# Patient Record
Sex: Female | Born: 1940 | Race: White | Hispanic: No | Marital: Married
Health system: Midwestern US, Academic
[De-identification: ages and names within clinical notes are randomized; demographics above are authoritative.]

---

## 2017-06-04 ENCOUNTER — Encounter: Admit: 2017-06-04 | Discharge: 2017-06-05 | Payer: MEDICARE

## 2017-06-04 DIAGNOSIS — R69 Illness, unspecified: Principal | ICD-10-CM

## 2017-07-07 ENCOUNTER — Encounter: Admit: 2017-07-07 | Discharge: 2017-07-07 | Payer: MEDICARE

## 2017-07-09 ENCOUNTER — Encounter: Admit: 2017-07-09 | Discharge: 2017-07-09 | Payer: MEDICARE

## 2017-07-09 DIAGNOSIS — R69 Illness, unspecified: Principal | ICD-10-CM

## 2017-07-09 DIAGNOSIS — C801 Malignant (primary) neoplasm, unspecified: Principal | ICD-10-CM

## 2017-07-09 DIAGNOSIS — I10 Essential (primary) hypertension: ICD-10-CM

## 2017-07-09 DIAGNOSIS — F419 Anxiety disorder, unspecified: ICD-10-CM

## 2017-07-09 DIAGNOSIS — D329 Benign neoplasm of meninges, unspecified: ICD-10-CM

## 2017-07-09 DIAGNOSIS — F329 Major depressive disorder, single episode, unspecified: ICD-10-CM

## 2017-07-13 ENCOUNTER — Encounter: Admit: 2017-07-13 | Discharge: 2017-07-13 | Payer: MEDICARE

## 2017-07-13 ENCOUNTER — Ambulatory Visit: Admit: 2017-07-13 | Discharge: 2017-07-14 | Payer: MEDICARE

## 2017-07-13 DIAGNOSIS — D329 Benign neoplasm of meninges, unspecified: Principal | ICD-10-CM

## 2017-07-13 DIAGNOSIS — F329 Major depressive disorder, single episode, unspecified: ICD-10-CM

## 2017-07-13 DIAGNOSIS — I10 Essential (primary) hypertension: ICD-10-CM

## 2017-07-13 DIAGNOSIS — C801 Malignant (primary) neoplasm, unspecified: Principal | ICD-10-CM

## 2017-07-13 DIAGNOSIS — F419 Anxiety disorder, unspecified: ICD-10-CM

## 2017-07-13 NOTE — Progress Notes
Date of Service: 07/13/2017    Subjective:             Angela Arias is a 76 y.o. female.    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  She was diagnosed with a left frontal parasagittal meningioma in 2013.   She had surgery at that time and was told that a small residual tumor was left on the superior sagittal sinus.  Pathology showed an atypical meningioma. Patient did not have radiation therapy after that.  She developed an episode of seizure postop.  She was then followed with serial imaging. In 2015, she was diagnosed with recurrence/progression.  The mass continued to progress over time. At this point, her current MRI shows the presence of a 4 cm tumor.  Clinically she is doing well. No weakness, or numbness. No significant headache. No speech difficulty.           Review of Systems   Constitutional: Positive for fatigue.   HENT: Positive for tinnitus.    Musculoskeletal: Positive for back pain.   Psychiatric/Behavioral: Positive for dysphoric mood. The patient is nervous/anxious.          Objective:         ??? amLODIPine (NORVASC) 5 mg tablet Take 5 mg by mouth daily.   ??? ascorbic acid(+) (VITAMIN C) 1,000 mg tablet Take 1 tablet by mouth daily.   ??? aspirin 81 mg chewable tablet Chew 81 mg by mouth daily. Take with food.   ??? Calcium-Vitamin D3-Vitamin K (VIACTIV) 500-500-40 mg-unit-mcg chew Chew 1 tablet by mouth daily.   ??? Fish Oil-Omega-3 Fatty Acids (FISH OIL) 360-1,200 mg cap Take 1 capsule by mouth daily.   ??? levothyroxine (SYNTHROID) 75 mcg tablet Take 75 mcg by mouth daily 30 minutes before breakfast.   ??? MULTIVITAMIN PO Take 1 tablet by mouth daily.   ??? PARoxetine (PAXIL) 20 mg tablet Take 20 mg by mouth daily.   ??? simvastatin (ZOCOR) 20 mg tablet Take 20 mg by mouth at bedtime daily.   ??? vitamin E 400 unit capsule Take 400 Units by mouth daily.         Physical Exam Constitutional: She is oriented to person, place, and time. She appears well-developed and well-nourished.   HENT:   Head: Normocephalic and atraumatic.   Eyes: Conjunctivae and EOM are normal. Pupils are equal, round, and reactive to light.   Neck: Neck supple.   Cardiovascular: Normal rate.    Pulmonary/Chest: Effort normal. No respiratory distress.   Abdominal: Soft. She exhibits no distension.   Neurological: She is alert and oriented to person, place, and time. She has normal strength. No cranial nerve deficit.   Limited movement of right shoulder because of previous dislocation and surgeries.   Skin: Skin is warm and dry.   Psychiatric: She has a normal mood and affect.            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  I discussed the condition with her and her family and answered their questions.  I recommend surgery for resection. I also recommend radiation therapy postop to minimize the risk of recurrence.  We talked in details about the risks of surgery.  She verbalized understanding and agreed with the plan.

## 2017-07-15 ENCOUNTER — Encounter: Admit: 2017-07-15 | Discharge: 2017-07-15 | Payer: MEDICARE

## 2017-07-15 DIAGNOSIS — D689 Coagulation defect, unspecified: ICD-10-CM

## 2017-07-15 DIAGNOSIS — D329 Benign neoplasm of meninges, unspecified: Principal | ICD-10-CM

## 2017-08-03 ENCOUNTER — Ambulatory Visit: Admit: 2017-08-03 | Discharge: 2017-08-03 | Payer: MEDICARE

## 2017-08-03 ENCOUNTER — Inpatient Hospital Stay: Admit: 2017-08-03 | Discharge: 2017-08-03 | Payer: MEDICARE

## 2017-08-03 ENCOUNTER — Encounter: Admit: 2017-08-03 | Discharge: 2017-08-03 | Payer: MEDICARE

## 2017-08-03 DIAGNOSIS — I6781 Acute cerebrovascular insufficiency: ICD-10-CM

## 2017-08-03 DIAGNOSIS — R011 Cardiac murmur, unspecified: ICD-10-CM

## 2017-08-03 DIAGNOSIS — F329 Major depressive disorder, single episode, unspecified: ICD-10-CM

## 2017-08-03 DIAGNOSIS — D689 Coagulation defect, unspecified: ICD-10-CM

## 2017-08-03 DIAGNOSIS — Z01810 Encounter for preprocedural cardiovascular examination: Secondary | ICD-10-CM

## 2017-08-03 DIAGNOSIS — D329 Benign neoplasm of meninges, unspecified: Principal | ICD-10-CM

## 2017-08-03 DIAGNOSIS — M1990 Unspecified osteoarthritis, unspecified site: ICD-10-CM

## 2017-08-03 DIAGNOSIS — R112 Nausea with vomiting, unspecified: ICD-10-CM

## 2017-08-03 DIAGNOSIS — I6529 Occlusion and stenosis of unspecified carotid artery: ICD-10-CM

## 2017-08-03 DIAGNOSIS — I10 Essential (primary) hypertension: ICD-10-CM

## 2017-08-03 DIAGNOSIS — F419 Anxiety disorder, unspecified: ICD-10-CM

## 2017-08-03 DIAGNOSIS — K5790 Diverticulosis of intestine, part unspecified, without perforation or abscess without bleeding: ICD-10-CM

## 2017-08-03 DIAGNOSIS — K219 Gastro-esophageal reflux disease without esophagitis: ICD-10-CM

## 2017-08-03 DIAGNOSIS — C801 Malignant (primary) neoplasm, unspecified: Principal | ICD-10-CM

## 2017-08-03 DIAGNOSIS — E785 Hyperlipidemia, unspecified: ICD-10-CM

## 2017-08-03 DIAGNOSIS — R569 Unspecified convulsions: ICD-10-CM

## 2017-08-03 DIAGNOSIS — E039 Hypothyroidism, unspecified: ICD-10-CM

## 2017-08-03 LAB — PTT (APTT): Lab: 31 s (ref 20.0–36.0)

## 2017-08-03 LAB — POC CREATININE, RAD: Lab: 0.7 mg/dL (ref 0.4–1.00)

## 2017-08-03 LAB — PROTIME INR (PT): Lab: 0.9 (ref 0.8–1.2)

## 2017-08-03 MED ORDER — GADOBENATE DIMEGLUMINE 529 MG/ML (0.1MMOL/0.2ML) IV SOLN
12 mL | Freq: Once | INTRAVENOUS | 0 refills | Status: CP
Start: 2017-08-03 — End: ?
  Administered 2017-08-03: 19:00:00 12 mL via INTRAVENOUS

## 2017-08-12 ENCOUNTER — Inpatient Hospital Stay: Admit: 2017-08-12 | Discharge: 2017-08-12 | Payer: MEDICARE

## 2017-08-12 ENCOUNTER — Encounter: Admit: 2017-08-12 | Discharge: 2017-08-12 | Payer: MEDICARE

## 2017-08-12 ENCOUNTER — Inpatient Hospital Stay: Admit: 2017-08-12 | Discharge: 2017-08-17 | Disposition: A | Discharge status: Home Health | Payer: MEDICARE

## 2017-08-12 DIAGNOSIS — R112 Nausea with vomiting, unspecified: ICD-10-CM

## 2017-08-12 DIAGNOSIS — D329 Benign neoplasm of meninges, unspecified: ICD-10-CM

## 2017-08-12 DIAGNOSIS — F329 Major depressive disorder, single episode, unspecified: ICD-10-CM

## 2017-08-12 DIAGNOSIS — C801 Malignant (primary) neoplasm, unspecified: Principal | ICD-10-CM

## 2017-08-12 DIAGNOSIS — K5790 Diverticulosis of intestine, part unspecified, without perforation or abscess without bleeding: ICD-10-CM

## 2017-08-12 DIAGNOSIS — E785 Hyperlipidemia, unspecified: ICD-10-CM

## 2017-08-12 DIAGNOSIS — F419 Anxiety disorder, unspecified: ICD-10-CM

## 2017-08-12 DIAGNOSIS — I10 Essential (primary) hypertension: ICD-10-CM

## 2017-08-12 DIAGNOSIS — M1990 Unspecified osteoarthritis, unspecified site: ICD-10-CM

## 2017-08-12 DIAGNOSIS — E039 Hypothyroidism, unspecified: ICD-10-CM

## 2017-08-12 DIAGNOSIS — R569 Unspecified convulsions: ICD-10-CM

## 2017-08-12 DIAGNOSIS — K219 Gastro-esophageal reflux disease without esophagitis: ICD-10-CM

## 2017-08-12 DIAGNOSIS — R011 Cardiac murmur, unspecified: ICD-10-CM

## 2017-08-12 LAB — POC HEMATOCRIT
Lab: 14 g/dL (ref 12.0–15.0)
Lab: 43 % (ref 36–45)

## 2017-08-12 LAB — POC BLOOD GAS ARTERIAL
Lab: 1 MMOL/L
Lab: 138 mmHg — ABNORMAL HIGH (ref 80–100)
Lab: 2 MMOL/L
Lab: 26 MMOL/L (ref 21–28)
Lab: 26 MMOL/L (ref 21–28)
Lab: 66 mmHg — ABNORMAL HIGH (ref >60)
Lab: 7.1 — CL (ref 7.35–7.45)
Lab: 7.2 mL/min — ABNORMAL LOW (ref >60)
Lab: 71 mmHg — ABNORMAL HIGH (ref 35–45)
Lab: 98 % (ref 95–99)
Lab: 7.2 — ABNORMAL LOW (ref 7.35–7.45)
Lab: 71 mmHg — ABNORMAL HIGH (ref 35–45)
Lab: 0 MMOL/L
Lab: 124 mmHg — ABNORMAL HIGH (ref 80–100)
Lab: 27 MMOL/L (ref 21–28)
Lab: 66 mmHg — ABNORMAL HIGH (ref 35–45)
Lab: 7.2 — ABNORMAL LOW (ref 7.35–7.45)
Lab: 98 % (ref 95–99)

## 2017-08-12 LAB — BLOOD GASES, ARTERIAL
Lab: 1.8 MMOL/L
Lab: 153 mmHg — ABNORMAL HIGH (ref 80–100)
Lab: 22 MMOL/L (ref 21–28)
Lab: 44 mmHg (ref 35–45)
Lab: 7.3 — ABNORMAL LOW (ref 7.35–7.45)
Lab: 97 % (ref 95–99)

## 2017-08-12 LAB — POC IONIZED CALCIUM: Lab: 1 MMOL/L (ref 1.0–1.3)

## 2017-08-12 LAB — POC SODIUM: Lab: 143 MMOL/L — ABNORMAL LOW (ref 137–147)

## 2017-08-12 LAB — POC GLUCOSE: Lab: 148 mg/dL — ABNORMAL HIGH (ref 70–100)

## 2017-08-12 LAB — POC POTASSIUM: Lab: 3.5 MMOL/L — ABNORMAL LOW (ref 3.5–5.1)

## 2017-08-12 MED ORDER — LEVETIRACETAM IVPB
0 refills | Status: DC
Start: 2017-08-12 — End: 2017-08-12
  Administered 2017-08-12 (×2): 1000 mg via INTRAVENOUS

## 2017-08-12 MED ORDER — EPHEDRINE SULFATE 50 MG/ML IJ SOLN
0 refills | Status: DC
Start: 2017-08-12 — End: 2017-08-12
  Administered 2017-08-12 (×3): 10 mg via INTRAVENOUS

## 2017-08-12 MED ORDER — DEXMEDETOMIDINE IV DRIP (STD CONC)
0.2-1 ug/kg/h | INTRAVENOUS | 0 refills | Status: DC
Start: 2017-08-12 — End: 2017-08-13
  Administered 2017-08-12 (×2): 0.6 ug/kg/h via INTRAVENOUS

## 2017-08-12 MED ORDER — PAROXETINE HCL 20 MG PO TAB
20 mg | Freq: Every evening | ORAL | 0 refills | Status: DC
Start: 2017-08-12 — End: 2017-08-17
  Administered 2017-08-14 – 2017-08-17 (×4): 20 mg via ORAL

## 2017-08-12 MED ORDER — LIDOCAINE (PF) 200 MG/10 ML (2 %) IJ SYRG
0 refills | Status: DC
Start: 2017-08-12 — End: 2017-08-12
  Administered 2017-08-12: 13:00:00 60 mg via INTRAVENOUS

## 2017-08-12 MED ORDER — PROPOFOL 10 MG/ML IV EMUL
5-150 ug/kg/min | INTRAVENOUS | 0 refills | Status: DC
Start: 2017-08-12 — End: 2017-08-12
  Administered 2017-08-12: 21:00:00 7 ug/kg/min via INTRAVENOUS

## 2017-08-12 MED ORDER — PROPOFOL INJ 10 MG/ML IV VIAL
0 refills | Status: DC
Start: 2017-08-12 — End: 2017-08-12
  Administered 2017-08-12: 13:00:00 60 mg via INTRAVENOUS
  Administered 2017-08-12: 13:00:00 110 mg via INTRAVENOUS
  Administered 2017-08-12: 13:00:00 30 mg via INTRAVENOUS

## 2017-08-12 MED ORDER — FENTANYL CITRATE (PF) 50 MCG/ML IJ SOLN
0 refills | Status: DC
Start: 2017-08-12 — End: 2017-08-12
  Administered 2017-08-12 (×2): 50 ug via INTRAVENOUS

## 2017-08-12 MED ORDER — FAMOTIDINE 20 MG PO TAB
40 mg | Freq: Every day | ORAL | 0 refills | Status: DC
Start: 2017-08-12 — End: 2017-08-12

## 2017-08-12 MED ORDER — SUCCINYLCHOLINE CHLORIDE 20 MG/ML IJ SOLN
2 mg/kg | Freq: Once | INTRAVENOUS | 0 refills | Status: CP
Start: 2017-08-12 — End: ?

## 2017-08-12 MED ORDER — SUFENTANIL 100 MCG IN NS 10 ML (OR)
0 refills | Status: DC
Start: 2017-08-12 — End: 2017-08-12
  Administered 2017-08-12 (×2): 0.1 ug/kg/h via INTRAVENOUS

## 2017-08-12 MED ORDER — DEXAMETHASONE SODIUM PHOSPHATE 4 MG/ML IJ SOLN
4 mg | INTRAVENOUS | 0 refills | Status: DC
Start: 2017-08-12 — End: 2017-08-14
  Administered 2017-08-12 – 2017-08-14 (×7): 4 mg via INTRAVENOUS

## 2017-08-12 MED ORDER — LEVETIRACETAM IN NACL (ISO-OS) 500 MG/100 ML IV PGBK
500 mg | Freq: Two times a day (BID) | INTRAVENOUS | 0 refills | Status: DC
Start: 2017-08-12 — End: 2017-08-12

## 2017-08-12 MED ORDER — LEVOTHYROXINE 75 MCG PO TAB
75 ug | Freq: Every day | ORAL | 0 refills | Status: DC
Start: 2017-08-12 — End: 2017-08-17
  Administered 2017-08-14 – 2017-08-17 (×4): 75 ug via ORAL

## 2017-08-12 MED ORDER — SENNOSIDES-DOCUSATE SODIUM 8.6-50 MG PO TAB
1 | Freq: Two times a day (BID) | ORAL | 0 refills | Status: DC
Start: 2017-08-12 — End: 2017-08-17
  Administered 2017-08-14 – 2017-08-17 (×6): 1 via ORAL

## 2017-08-12 MED ORDER — ACETAMINOPHEN 325 MG PO TAB
650 mg | ORAL | 0 refills | Status: DC | PRN
Start: 2017-08-12 — End: 2017-08-17
  Administered 2017-08-14: 02:00:00 650 mg via ORAL

## 2017-08-12 MED ORDER — ACETAMINOPHEN 500 MG PO TAB
1000 mg | Freq: Once | ORAL | 0 refills | Status: DC
Start: 2017-08-12 — End: 2017-08-13

## 2017-08-12 MED ORDER — BISACODYL 10 MG RE SUPP
10 mg | Freq: Every day | RECTAL | 0 refills | Status: DC | PRN
Start: 2017-08-12 — End: 2017-08-17

## 2017-08-12 MED ORDER — LEVETIRACETAM 500 MG PO TAB
500 mg | Freq: Two times a day (BID) | ORAL | 0 refills | Status: DC
Start: 2017-08-12 — End: 2017-08-13

## 2017-08-12 MED ORDER — AMLODIPINE 5 MG PO TAB
5 mg | Freq: Every day | ORAL | 0 refills | Status: DC
Start: 2017-08-12 — End: 2017-08-17
  Administered 2017-08-14 – 2017-08-17 (×4): 5 mg via ORAL

## 2017-08-12 MED ORDER — ONDANSETRON HCL (PF) 4 MG/2 ML IJ SOLN
INTRAVENOUS | 0 refills | Status: DC
Start: 2017-08-12 — End: 2017-08-12
  Administered 2017-08-12: 17:00:00 4 mg via INTRAVENOUS

## 2017-08-12 MED ORDER — BACITRACIN ZINC 500 UNIT/GRAM TP OINT
0 refills | Status: DC
Start: 2017-08-12 — End: 2017-08-12
  Administered 2017-08-12: 15:00:00 1 via TOPICAL

## 2017-08-12 MED ORDER — GABAPENTIN 300 MG PO CAP
600 mg | Freq: Once | ORAL | 0 refills | Status: DC
Start: 2017-08-12 — End: 2017-08-13

## 2017-08-12 MED ORDER — DEXMEDETOMIDINE IN 0.9 % NACL 400 MCG/100 ML (4 MCG/ML) IV SOLN
.2-1 ug/kg/h | INTRAVENOUS | 0 refills | Status: DC
Start: 2017-08-12 — End: 2017-08-13
  Administered 2017-08-13: 04:00:00 0.6 ug/kg/h via INTRAVENOUS

## 2017-08-12 MED ORDER — FAMOTIDINE (PF) 20 MG/2 ML IV SOLN
20 mg | Freq: Two times a day (BID) | INTRAVENOUS | 0 refills | Status: DC
Start: 2017-08-12 — End: 2017-08-13
  Administered 2017-08-13 (×2): 20 mg via INTRAVENOUS

## 2017-08-12 MED ORDER — DOCUSATE SODIUM 100 MG PO CAP
100 mg | Freq: Two times a day (BID) | ORAL | 0 refills | Status: DC
Start: 2017-08-12 — End: 2017-08-17
  Administered 2017-08-14 – 2017-08-16 (×5): 100 mg via ORAL

## 2017-08-12 MED ORDER — SODIUM CHLORIDE 0.9 % IV SOLP
1000 mL | INTRAVENOUS | 0 refills | Status: DC
Start: 2017-08-12 — End: 2017-08-12

## 2017-08-12 MED ORDER — POTASSIUM CHLORIDE 20 MEQ PO TBTQ
40-60 meq | ORAL | 0 refills | Status: DC | PRN
Start: 2017-08-12 — End: 2017-08-14

## 2017-08-12 MED ORDER — SUGAMMADEX 100 MG/ML IV SOLN
INTRAVENOUS | 0 refills | Status: DC
Start: 2017-08-12 — End: 2017-08-12
  Administered 2017-08-12: 17:00:00 120 mg via INTRAVENOUS

## 2017-08-12 MED ORDER — BACITRACIN 50,000 UN LR 500 ML IRR BOT (OR)
0 refills | Status: DC
Start: 2017-08-12 — End: 2017-08-12
  Administered 2017-08-12 (×2): 500 mL

## 2017-08-12 MED ORDER — POTASSIUM CHLORIDE 20 MEQ/15 ML PO LIQD
40-60 meq | NASOGASTRIC | 0 refills | Status: DC | PRN
Start: 2017-08-12 — End: 2017-08-14
  Administered 2017-08-14: 05:00:00 40 meq via NASOGASTRIC

## 2017-08-12 MED ORDER — CALCIUM GLUCONATE 1GM/100ML NS MB+
1 g | INTRAVENOUS | 0 refills | Status: DC | PRN
Start: 2017-08-12 — End: 2017-08-14

## 2017-08-12 MED ORDER — PROPRANOLOL 1 MG/ML IV SOLN
0 refills | Status: CP
Start: 2017-08-12 — End: ?
  Administered 2017-08-12: 21:00:00 70 mg via INTRAVENOUS

## 2017-08-12 MED ORDER — THROMBIN (BOVINE) 5,000 UNIT TP SOLR
0 refills | Status: DC
Start: 2017-08-12 — End: 2017-08-12
  Administered 2017-08-12: 15:00:00 5000 [IU] via TOPICAL

## 2017-08-12 MED ORDER — DEXAMETHASONE SODIUM PHOSPHATE 4 MG/ML IJ SOLN
INTRAVENOUS | 0 refills | Status: DC
Start: 2017-08-12 — End: 2017-08-12
  Administered 2017-08-12: 14:00:00 10 mg via INTRAVENOUS

## 2017-08-12 MED ORDER — MANNITOL 20 % 20 % IV SOLP
0 refills | Status: DC
Start: 2017-08-12 — End: 2017-08-12
  Administered 2017-08-12: 14:00:00 50 g via INTRAVENOUS

## 2017-08-12 MED ORDER — PHENYLEPHRINE IV DRIP (STD CONC)
0 refills | Status: DC
Start: 2017-08-12 — End: 2017-08-12
  Administered 2017-08-12 (×2): 0.2 ug/kg/min via INTRAVENOUS

## 2017-08-12 MED ORDER — SODIUM CHLORIDE 0.9 % IV SOLP
INTRAVENOUS | 0 refills | Status: DC
Start: 2017-08-12 — End: 2017-08-14
  Administered 2017-08-12 – 2017-08-14 (×4): 1000.000 mL via INTRAVENOUS

## 2017-08-12 MED ORDER — HYDROCODONE-ACETAMINOPHEN 5-325 MG PO TAB
1-2 | ORAL | 0 refills | Status: DC | PRN
Start: 2017-08-12 — End: 2017-08-12

## 2017-08-12 MED ORDER — DEXTRAN 70-HYPROMELLOSE (PF) 0.1-0.3 % OP DPET
0 refills | Status: DC
Start: 2017-08-12 — End: 2017-08-12
  Administered 2017-08-12: 13:00:00 2 [drp] via OPHTHALMIC

## 2017-08-12 MED ORDER — CEFAZOLIN INJ 1GM IVP
1 g | INTRAVENOUS | 0 refills | Status: CP
Start: 2017-08-12 — End: ?
  Administered 2017-08-12 – 2017-08-13 (×3): 1 g via INTRAVENOUS

## 2017-08-12 MED ORDER — OXYCODONE 5 MG PO TAB
5-10 mg | ORAL | 0 refills | Status: DC | PRN
Start: 2017-08-12 — End: 2017-08-17

## 2017-08-12 MED ORDER — FENTANYL CITRATE (PF) 50 MCG/ML IJ SOLN
25-50 ug | INTRAVENOUS | 0 refills | Status: DC | PRN
Start: 2017-08-12 — End: 2017-08-14
  Administered 2017-08-13: 08:00:00 50 ug via INTRAVENOUS
  Administered 2017-08-13: 02:00:00 25 ug via INTRAVENOUS
  Administered 2017-08-13: 09:00:00 50 ug via INTRAVENOUS
  Administered 2017-08-13: 06:00:00 25 ug via INTRAVENOUS

## 2017-08-12 MED ORDER — ESMOLOL 100 MG/10 ML (10 MG/ML) IV SOLN
0 refills | Status: DC
Start: 2017-08-12 — End: 2017-08-12
  Administered 2017-08-12 (×2): 20 mg via INTRAVENOUS

## 2017-08-12 MED ORDER — ONDANSETRON HCL (PF) 4 MG/2 ML IJ SOLN
4 mg | INTRAVENOUS | 0 refills | Status: DC | PRN
Start: 2017-08-12 — End: 2017-08-17

## 2017-08-12 MED ORDER — CEFAZOLIN 1 GRAM IJ SOLR
0 refills | Status: DC
Start: 2017-08-12 — End: 2017-08-12
  Administered 2017-08-12: 14:00:00 2 g via INTRAVENOUS

## 2017-08-12 MED ORDER — NICARDIPINE IN NACL (ISO-OS) 20 MG/200 ML IV PGBK (INFUSION)(AM)(OR)
0 refills | Status: DC
Start: 2017-08-12 — End: 2017-08-12
  Administered 2017-08-12: 17:00:00 5 mg/h via INTRAVENOUS

## 2017-08-12 MED ORDER — SUCCINYLCHOLINE CHLORIDE 20 MG/ML IJ SOLN
0 refills | Status: CP
Start: 2017-08-12 — End: ?
  Administered 2017-08-12: 21:00:00 140 mg via INTRAVENOUS

## 2017-08-12 MED ORDER — HALOPERIDOL LACTATE 5 MG/ML IJ SOLN
0 refills | Status: DC
Start: 2017-08-12 — End: 2017-08-12
  Administered 2017-08-12: 17:00:00 1 mg via INTRAVENOUS

## 2017-08-12 MED ORDER — LEVETIRACETAM IN NACL (ISO-OS) 500 MG/100 ML IV PGBK
500 mg | Freq: Two times a day (BID) | INTRAVENOUS | 0 refills | Status: DC
Start: 2017-08-12 — End: 2017-08-13
  Administered 2017-08-13 (×2): 500 mg via INTRAVENOUS

## 2017-08-12 MED ORDER — ELECTROLYTE-A IV SOLP
0 refills | Status: DC
Start: 2017-08-12 — End: 2017-08-12
  Administered 2017-08-12 (×2): via INTRAVENOUS

## 2017-08-12 MED ORDER — PHENYLEPHRINE IN 0.9% NACL(PF) 1 MG/10 ML (100 MCG/ML) IV SYRG
INTRAVENOUS | 0 refills | Status: DC
Start: 2017-08-12 — End: 2017-08-12
  Administered 2017-08-12 (×6): 100 ug via INTRAVENOUS

## 2017-08-12 MED ORDER — ROCURONIUM 10 MG/ML IV SOLN
INTRAVENOUS | 0 refills | Status: DC
Start: 2017-08-12 — End: 2017-08-12
  Administered 2017-08-12: 13:00:00 35 mg via INTRAVENOUS

## 2017-08-12 MED ORDER — PROPOFOL 10 MG/ML IV EMUL (INFUSION)(AM)(OR)
0 refills | Status: DC
Start: 2017-08-12 — End: 2017-08-12
  Administered 2017-08-12: 13:00:00 150 ug/kg/min via INTRAVENOUS

## 2017-08-12 MED ORDER — MAGNESIUM SULFATE IN D5W 1 GRAM/100 ML IV PGBK
1 g | INTRAVENOUS | 0 refills | Status: DC | PRN
Start: 2017-08-12 — End: 2017-08-14

## 2017-08-12 MED ADMIN — NALOXONE 0.4 MG/ML IJ SOLN [5373]: 0.04 mg | INTRAVENOUS | @ 19:00:00 | Stop: 2017-08-12 | NDC 00409121501

## 2017-08-12 NOTE — Progress Notes
Code/Rapid Response Medication Nurse Sign Off      Patient: Angela Arias    I have reviewed the Code/Rapid Response Timeline Event Report and confirm that I administered the medications listed on this patient during the event on date 08/12/17 at event start time 1547.

## 2017-08-12 NOTE — Other
RT Critical Blood Gas Result Notification     Blood gas type (ABG, VBG, capillary): ABG   Critical result(s): 7.20 PH, 71 PCO2, 113 PO2     Results called to MD/DO/NP/PA Dr. Twanna Hy with read back  Results handed to MD/DO/NP/PA Dr. Twanna Hy  Time notified: 1505    MD/DO/NP/PA response/orders given: increase IPAP to 14

## 2017-08-12 NOTE — Other
RT Critical Blood Gas Result Notification     Blood gas type (ABG, VBG, capillary): ABG   Critical result(s): 7.18 PH, 71 PCO2, 138 PO2     Results called to MD/DO/NP/PA Dr. Eino Farber MD with read back  Results handed to MD/DO/NP/PA Dr. Eino Farber MD  Time notified: 1336    MD/DO/NP/PA response/orders given: MD told RT to prepare to put patient on NIPPV.

## 2017-08-12 NOTE — Progress Notes
Patient arrived to room # 5125) via bed accompanied by RN. Patient transferred to the bed with assistance. Bedside safety checks completed. Initial patient assessment completed, refer to flowsheet for details. Admission skin assessment completed by:     Pressure Injury Present on Hospital Admission (within 24 hours): No    1. Occiput: No  2. Ear: No  3. Scapula: No  4. Spinous Process: No  5. Shoulder: No  6. Elbow: No  7. Iliac Crest: No  8. Sacrum/Coccyx: No  9. Ischial Tuberosity: No  10. Trochanter: No  11. Knee: No  12. Malleolus: No  13. Heel: No  14. Toes: No  15. Assessed for device associated injury Yes  16. Nursing Nutrition Assessment Completed Yes        1300: Pt arrived to 5125. Upon assessment, pt was not responding to voice or any pain stimuli but PERLLA. Notified NS and ICU teams. Both teams came and assessed and determined patient is still wearing off from surgery. Stat ABGs showed patient in respiratory acidosis.     1400-1500: Patient now localizing to painful stimuli and opening eyes to voice.     1530: After attempting BiPap to relieve hypercapnia, Bo Merino, MD deemed it best to intubate. This RN present during intubation. VSS after intubation.

## 2017-08-12 NOTE — Consults
Neuro Critical Care Consult       Angela Arias  Admission Date: 08/12/2017  LOS: 0 days  Full Code                      ASSESSMENT/PLAN     Patient Active Problem List    Diagnosis Date Noted   ??? Meningioma (HCC) 07/13/2017       Angela Arias is a 76 y.o. female with pmh of meningioma, hypothyroidism, HTN, HLD, GERD, anxiety, Depression and arthritis who presents to [facility] s/p craniotomy with resection of meningioma 9/26    Hospital and ICU course:   9/26: To OR for craniotomy for resection of meningioma    Neuro:   Meningioma s/p craniotomy and resection  Altered mental status  - Q1H neuro checks  - PT/OT  - Post op ancef x 3 doses  - Decadron  - Keppra  - Post op MRI  - Narcan    Sedation/Pain Management:  - PRN acetaminophen, oxycodone and fentanyl   - Continue PTA Paxil    Cardiac:   - SBP goal < 160  - MAP goal > 65  - Continue PTA Norvasc    Respiratory:    Acute hypercarbic respiratory failure s/p craniotomy  - Maintain SpO2>92%  - Currently on Bipap  - pH 7.18 with pCO2 71 @ 1345  - Repeat ABG    GI:  - Feeding: ADAT  - neurosurgery bowel regimen, ensure daily BM    Heme:   - assess for coagulopathy, maintain platelets above 100k, INR <1.5  - Hgb 14.6  - Continue to monitor with daily CBC    ID:   - Tmax afebrile   - aim for normothermia, Temp <38.3 celsius, normothermia protocol if febrile      Renal:   - I/O balance +600 mL since admission  - Aim for normovolemia    Endocrine:    - Blood glucose goal 100-180mg /dl  - Continue PTA synthroid    FEN:   - IVF: NS@50  mL/hr  - Magnesium goal >2.0, i-Cal goal > 1.0, Potassium goal >4.0 mEq/L  - On electrolyte replacement protocol    Prophylaxis Review:   A)GI: H2 blocker  B) Lines:  Yes; Arterial Line; Indication:  Continuous BP monitoring; Location:  Radial  C) Urinary Catheter:  Yes; Retain foley due to:  Need for accurate Intake and Output  D) Antibiotic Usage:  Post op ancef  E) VTE:  Mechanical prophylaxis; Sequential compression device F) Isolation: None  G)Seizures: Keppra  I) Restraints: Patient assessed for need for restraints.   Disposition/Family:   ICU status    Primary service: Neurosurgery    Consults:  NEICU    ___________________________________________________________________  SUBJECTIVE   Chief Complaint:  Post op craniotomy    History of Present Illness: Angela Arias is a 76 y.o. female with pmh of meningioma, hypothyroidism, HLD, HTN, GERD, anxiety and depression who presents to [facility] s/p craniotomy for resection of meningioma. Patient arrived to the unit obtunded and unable to follow commands.     Past Medical History:   Diagnosis Date   ??? Anxiety disorder    ??? Arthritis    ??? Cancer (HCC)     pancreas and stomach   ??? Depression    ??? Diverticulosis    ??? GERD (gastroesophageal reflux disease)    ??? Heart murmur    ??? HLD (hyperlipidemia)    ??? Hypertension    ??? Hypothyroidism    ???  Meningioma (HCC) 2012   ??? PONV (postoperative nausea and vomiting)    ??? Seizure (HCC)     times 1 after first craniotomy       Past Surgical History:   Procedure Laterality Date   ??? WHIPPLE  2010   ??? CRANIOTOMY  03/14/2011   ??? BREAST SURGERY Left     benign   ??? HX TONSILLECTOMY     ??? HX TUBAL LIGATION     ??? ROTATOR CUFF REPAIR Right        Family History   Problem Relation Age of Onset   ??? Cancer Mother         colon   ??? Cancer Brother         colon   ??? Cirrhosis Brother        Social History     Social History Narrative   ??? No narrative on file       Code Status: Full Code       Immunizations (includes history and patient reported):   There is no immunization history on file for this patient.        Allergies:  Patient has no known allergies.    Prescriptions Prior to Admission   Medication Sig   ??? amLODIPine (NORVASC) 5 mg tablet Take 5 mg by mouth daily.   ??? ascorbic acid(+) (VITAMIN C) 1,000 mg tablet Take 1 tablet by mouth daily.   ??? aspirin 81 mg chewable tablet Chew 81 mg by mouth daily. Take with food. ??? Calcium-Vitamin D3-Vitamin K (VIACTIV) 500-500-40 mg-unit-mcg chew Chew 1 tablet by mouth daily.   ??? famotidine(+) (PEPCID) 40 mg tablet Take 40 mg by mouth daily before breakfast.   ??? Fish Oil-Omega-3 Fatty Acids (FISH OIL) 360-1,200 mg cap Take 1 capsule by mouth daily.   ??? levothyroxine (SYNTHROID) 75 mcg tablet Take 75 mcg by mouth daily 30 minutes before breakfast.   ??? MULTIVITAMIN PO Take 1 tablet by mouth daily.   ??? PARoxetine (PAXIL) 20 mg tablet Take 20 mg by mouth at bedtime daily.   ??? polyvinyl alcohol/povidone(+) (REFRESH) 1.4/0.6 % ophthalmic solution Apply 1 drop to both eyes as Needed.   ??? simvastatin (ZOCOR) 20 mg tablet Take 1 tablet (20mg ) by mouth on Monday, Wednesday, and Friday at bedtime.   ??? vitamin E 400 unit capsule Take 400 Units by mouth daily.       Review of Systems:  Review of systems not obtained from patient due to patient factors.        OBJECTIVE                     Vital Signs: Last Filed                  Vital Signs: 24 Hour Range   BP: 135/74 (09/26 1300)  ABP: 132/72 (09/26 1300)  Temp: 36.8 ???C (98.2 ???F) (09/26 1300)  Pulse: 105 (09/26 1300)  Respirations: 13 PER MINUTE (09/26 1300)  SpO2: 95 % (09/26 1300)  O2 Delivery: Simple Mask (09/26 1300)  Height: 157.5 cm (62) (09/26 0723)  Weight: 63.6 kg (140 lb 3.2 oz) (09/26 0723)  BP: (135)/(64-74)   ABP: (132)/(72)   Temp:  [36.6 ???C (97.9 ???F)-36.8 ???C (98.2 ???F)]   Pulse:  [88-105]   Respirations:  [13 PER MINUTE-16 PER MINUTE]   SpO2:  [95 %-97 %]   O2 Delivery: Simple Mask    Intensity Pain Scale (Self Report): (not recorded)  Vitals:    08/12/17 0723   Weight: 63.6 kg (140 lb 3.2 oz)         Artificial airway:  None              Ventilator/ Respiratory Therapy:  Yes: Bipap  Vent weaning trial:  Not applicable    Lines:  Arterial Line and Peripheral Line  Drains: Foley Catheter: 1000 mL      Intake/Output Summary:  (Last 24 hours)    Intake/Output Summary (Last 24 hours) at 08/12/17 1313  Last data filed at 08/12/17 1254 Gross per 24 hour   Intake             1700 ml   Output              900 ml   Net              800 ml            Physical Exam:    Blood pressure 135/74, pulse 105, temperature 36.8 ???C (98.2 ???F), height 157.5 cm (62), weight 63.6 kg (140 lb 3.2 oz), SpO2 95 %.    Glasgow coma score:         8     E: 2 - Opens eyes with pain          M: 4 - Moves part of body but does not remove noxious stimulus              V: 2 - Moans, makes unintelligible sounds    Neuro:   Mental Status: obtunded   Cranial Nerves: unable to fully assess due to lack of patient cooperation      - Pupil exam: Size:         3mm               Reactivity: Brisk OU                 - Corneal reflex: R - present L - present      Motor: unable to assess due to patient factors    Lungs: clear to auscultation bilaterally                     Heart: regular rate and rhythm, S1, S2 normal, no murmur, click, rub or gallop  Abdomen: soft, non-tender. Bowel sounds normal. No masses,  no organomegaly  Extremities: no edema, redness or tenderness in the calves or thighs  Skin: Skin color, texture, turgor normal. No rashes or lesions      Lab Review:    24-hour labs:    Results for orders placed or performed during the hospital encounter of 08/12/17 (from the past 24 hour(s))   TYPE & CROSSMATCH    Collection Time: 08/12/17  6:54 AM   Result Value Ref Range    Units Ordered 2     Crossmatch Expires 08/15/2017     Record Check FOUND     ABO/RH(D) O POS     Antibody Screen NEG     Electronic Crossmatch YES    POC GLUCOSE    Collection Time: 08/12/17  1:30 PM   Result Value Ref Range    Glucose, POC 148 (H) 70 - 100 MG/DL   POC BLOOD GAS ARTERIAL    Collection Time: 08/12/17  1:36 PM   Result Value Ref Range    PH-ART-POC 7.18 (LL) 7.35 - 7.45    PCO2-ART-POC 71 (HH) 35 - 45 MMHG  PO2-ART-POC 138 (H) 80 - 100 MMHG    Base Def-ART-POC 2.0 MMOL/L    O2 Sat-ART-POC 98.0 95 - 99 %    Bicarbonate-ART-POC 26.3 21 - 28 MMOL/L   POC HEMATOCRIT Collection Time: 08/12/17  1:36 PM   Result Value Ref Range    Hemoglobin POC 14.6 12.0 - 15.0 GM/DL    Hematocrit POC 16.1 36 - 45 %   POC POTASSIUM    Collection Time: 08/12/17  1:36 PM   Result Value Ref Range    Potassium-POC 3.5 3.5 - 5.1 MMOL/L   POC SODIUM    Collection Time: 08/12/17  1:36 PM   Result Value Ref Range    Sodium-POC 143 137 - 147 MMOL/L   POC IONIZED CALCIUM    Collection Time: 08/12/17  1:36 PM   Result Value Ref Range    Ionized Calcium-POC 1.08 1.0 - 1.3 MMOL/L   POC BLOOD GAS ARTERIAL    Collection Time: 08/12/17  2:24 PM   Result Value Ref Range    PH-ART-POC 7.21 (L) 7.35 - 7.45    PCO2-ART-POC 66 (H) 35 - 45 MMHG    PO2-ART-POC 107 (H) 80 - 100 MMHG    Base Def-ART-POC 1.0 MMOL/L    O2 Sat-ART-POC 97.0 95 - 99 %    Bicarbonate-ART-POC 26.5 21 - 28 MMOL/L       Radiology and Other Diagnostic Procedures Review:  Pertinent radiologic and diagnostic procedures reviewed.        Mills Koller, MD Date:  08/12/2017   [phone] [facility] Pager  PGY-1    Patient seen and discussed with Dr. Cherlynn Kaiser

## 2017-08-12 NOTE — Anesthesia Post-Procedure Evaluation
Post-Anesthesia Evaluation    Name: Angela Arias      MRN: [id number]     DOB: [birth year]     Age: 76 y.o.     Sex: female   __________________________________________________________________________     Procedure Date: 08/12/2017  Procedure: Procedure(s) with comments:  BIFRONTAL CRANIOTOMY, RESECTION OF MENINGIOMA - CASE LENGTH 4 HOURS, POSITION SUPINE, NAVIGATION (MRI 9/17)      Surgeon: Surgeon(s):  Joya Salm, MD  Derry Lory, MD    Post-Anesthesia Vitals  HR 106  BP 123/83  SPo2 99%         Post Anesthesia Evaluation Note    Evaluation location: ICU  Patient participation: recovered; patient participated in evaluation  Level of consciousness: sleepy but conscious    Pain score: 0  Pain management: adequate    Hydration: normovolemia  Temperature: 36.0C - 38.4C  Airway patency: adequate    Perioperative Events  Perioperative events:  no       Post-op nausea and vomiting: no PONV    Postoperative Status  Cardiovascular status: hemodynamically stable  Respiratory status: spontaneous ventilation  Follow-up needed: none  ICU Information  VasoactiveDrips:other and Nicardipine 2.5mg /hr  Blood Products Given-no              NSICU information    ICP lowering technique in OR: mannitol  no ICP monitor used    anticonvulsants were given  Staff involved in transport include: anesthesiologist and anes resident      Perioperative Events  Perioperative Event: No

## 2017-08-12 NOTE — Interval H&P Note
History and Physical Update Note    Allergies:  Patient has no known allergies.    Lab/Radiology/Other Diagnostic Tests:  24-hour labs:  No results found for this visit on 08/12/17 (from the past 24 hour(s)).  Point of Care Testing:  (Last 24 hours):         I have examined the patient, and there are no significant changes in their condition, from the previous H&P performed on 07/13/2017.    Joya Salm, MD  Pager     --------------------------------------------------------------------------------------------------------------------------------------------

## 2017-08-12 NOTE — Anesthesia Procedure Notes
Anesthesia Procedure: Arterial Line Placement    A-LINE INSERTION  Date/Time: 08/12/2017 8:07 AM    Patient location: OR  Indications: hemodynamic monitoring      Preprocedure checklist performed: 2 patient identifiers, risks & benefits discussed, patient evaluated, timeout performed, consent obtained, patient being monitored and sterile drape    Sterile technique:  - Proper hand washing  - Cap, mask  - Sterile gloves  - Skin prep for antisepsis        Arterial Line Procedure   Patient sedated: yes (see MAR)  Sedation type: general;   Artery prepped with chlorhexidine; skin prep agent completely dried prior to procedure.  Location: radial artery  Laterality: right  Technique: palpation  Ultrasound image captured  Needle gauge: 20 G  Number of attempts: 2    Procedure Outcome  Catheter secured with adhesive dressing applied  Events: no complications noted during insertion and skin intact, warm, and dry    Observation: pt tolerated well        Performed by: Alyson Reedy  Authorized by: Carloyn Manner

## 2017-08-12 NOTE — Other
Endotracheal Intubation Procedure Note  Date of Service: 08/12/2017  Indication for endotracheal intubation: Respiratory failure  Pre-Op Vitals:   Vitals:    08/12/17 1601   BP: 140/69   Pulse: 98   Temp:    SpO2: 100%     Labs: K+ No results found for: K    Equipment: Macintosh 3 laryngoscope blade.  Sedation: propofol  Paralytic: Succinylcholine  Grade View: 4  Number of attempts: 1  ETT location confirmed by auscultation, cxr pending    Dr. Bo Merino present throughout procedure    Performed by Kyra Searles, MD

## 2017-08-12 NOTE — Response Teams
Code Blue Team Progress Note    Date: 08/12/2017 Time: 4:02 PM  Patient: Angela Arias  Attending: Joya Salm, MD Service: Surgery-Neuro  Admission Date: 08/12/2017  LOS: 0 days    A Code/Rapid Response Timeline Event Report has been created for this patient on 08/12/17 at 1547    ETT Confirmation Method: Exhaled CO2 by color change.    The lead provider for this event was Dr. Delle Reining, RN

## 2017-08-13 LAB — CBC: Lab: 13 K/UL — ABNORMAL HIGH (ref 4.5–11.0)

## 2017-08-13 LAB — BASIC METABOLIC PANEL
Lab: 142 MMOL/L — ABNORMAL LOW (ref >60)
Lab: 3.8 MMOL/L — ABNORMAL LOW (ref 3.5–5.1)
Lab: 0.6 mg/dL (ref 0.4–1.00)
Lab: 111 MMOL/L — ABNORMAL HIGH (ref 98–110)
Lab: 138 mg/dL — ABNORMAL HIGH (ref 70–100)
Lab: 141 MMOL/L (ref 137–147)
Lab: 16 mg/dL (ref 7–25)
Lab: 23 MMOL/L (ref 21–30)
Lab: 3.5 MMOL/L (ref 3.5–5.1)
Lab: 7 (ref 3–12)
Lab: 8.2 mg/dL — ABNORMAL LOW (ref 8.5–10.6)
Lab: >60 mL/min (ref >60)
Lab: >60 mL/min (ref >60)

## 2017-08-13 LAB — POC GLUCOSE
Lab: 157 mg/dL — ABNORMAL HIGH (ref 70–100)
Lab: 124 mg/dL — ABNORMAL HIGH (ref 70–100)
Lab: 111 mg/dL — ABNORMAL HIGH (ref 70–100)

## 2017-08-13 LAB — BLOOD GASES, ARTERIAL: Lab: 7.4 M/UL — ABNORMAL LOW (ref >60)

## 2017-08-13 LAB — MAGNESIUM: Lab: 2.3 mg/dL — ABNORMAL LOW (ref >60)

## 2017-08-13 LAB — PHOSPHORUS: Lab: 3.7 mg/dL — ABNORMAL LOW (ref 2.0–4.5)

## 2017-08-13 MED ORDER — CHLORHEXIDINE GLUCONATE 0.12 % MM MWSH
15 mL | Freq: Two times a day (BID) | ORAL | 0 refills | Status: DC
Start: 2017-08-13 — End: 2017-08-13
  Administered 2017-08-13: 15:00:00 15 mL via ORAL

## 2017-08-13 MED ORDER — SODIUM CHLORIDE 0.9 % IV SOLP
500 mL | INTRAVENOUS | 0 refills | Status: DC
Start: 2017-08-13 — End: 2017-08-14

## 2017-08-13 MED ORDER — LEVETIRACETAM 500 MG PO TAB
500 mg | Freq: Two times a day (BID) | ORAL | 0 refills | Status: DC
Start: 2017-08-13 — End: 2017-08-17
  Administered 2017-08-14 – 2017-08-17 (×8): 500 mg via ORAL

## 2017-08-13 MED ORDER — FAMOTIDINE 20 MG PO TAB
20 mg | Freq: Two times a day (BID) | ORAL | 0 refills | Status: DC
Start: 2017-08-13 — End: 2017-08-17
  Administered 2017-08-14 – 2017-08-17 (×8): 20 mg via ORAL

## 2017-08-13 MED ORDER — GADOBENATE DIMEGLUMINE 529 MG/ML (0.1MMOL/0.2ML) IV SOLN
12 mL | Freq: Once | INTRAVENOUS | 0 refills | Status: CP
Start: 2017-08-13 — End: ?
  Administered 2017-08-13: 06:00:00 12 mL via INTRAVENOUS

## 2017-08-13 MED ORDER — INSULIN ASPART 100 UNIT/ML SC FLEXPEN
0-7 [IU] | Freq: Every day | SUBCUTANEOUS | 0 refills | Status: DC
Start: 2017-08-13 — End: 2017-08-14
  Administered 2017-08-13: 14:00:00 1 [IU] via SUBCUTANEOUS

## 2017-08-13 NOTE — Anesthesia Pain Rounding
Anesthesia Follow-Up Evaluation: Post-Procedure Day One    Name: Angela Arias     MRN: [id number]     DOB: 04-17-1941     Age: 76 y.o.     Sex: female   __________________________________________________________________________     Procedure Date: 08/12/2017   Procedure: Procedure(s) with comments:  BIFRONTAL CRANIOTOMY, RESECTION OF MENINGIOMA - CASE LENGTH 4 HOURS, POSITION SUPINE, NAVIGATION (MRI 9/17)    Physical Assessment  Height: 157.5 cm (62)  Weight: 66.5 kg (146 lb 9.7 oz)    Vital Signs (Last Filed in 24 hours)  BP: 121/59 (09/27 1000)  Temp: 36.7 ???C (98 ???F) (09/27 0800)  Pulse: 58 (09/27 1000)  Respirations: 18 PER MINUTE (09/27 1000)  SpO2: 100 % (09/27 1000)  O2 Delivery: Endotracheal Tube (Oral) (09/27 1000)  SpO2 Pulse: 58 (09/27 1000)    Patient History   Allergies  No Known Allergies     Medications  Scheduled Meds:  acetaminophen (TYLENOL) tablet 1,000 mg 1,000 mg Oral ONCE   amLODIPine (NORVASC) tablet 5 mg 5 mg Oral QDAY   chlorhexidine gluconate (PERIDEX) 0.12 % solution 15 mL 15 mL Swish & Spit BID(8-20)   dexamethasone (DECADRON) injection 4 mg 4 mg Intravenous Q6H   docusate (COLACE) capsule 100 mg 100 mg Oral BID   famotidine (PEPCID) injection 20 mg 20 mg Intravenous BID   gabapentin (NEURONTIN) capsule 600 mg 600 mg Oral ONCE   insulin aspart U-100 (NOVOLOG FLEXPEN) injection PEN 0-7 Units 0-7 Units Subcutaneous 5 X Day   levETIRAcetam in NaCl (iso-os) (KEPPRA) IVPB (premade) 500 mg 100 mL 500 mg Intravenous BID   levothyroxine (SYNTHROID) tablet 75 mcg 75 mcg Oral QDAY 30 min before breakfast   PARoxetine (PAXIL) tablet 20 mg 20 mg Oral QHS   senna/docusate (SENOKOT-S) tablet 1 tablet 1 tablet Oral BID   Continuous Infusions:  ??? dexmedetomidine (PRECEDEX) 400 mcg/NS 100 ml IV drip Stopped (08/13/17 0725)   ??? sodium chloride 0.9 %   infusion 50 mL/hr at 08/13/17 1018     PRN and Respiratory Meds:acetaminophen Q4H PRN, [START ON 08/14/2017] bisacodyl QDAY PRN, calcium gluconate  IV PRN (On Call from Rx) **AND** Ionized Calcium PRN **AND** Notify Physician Ongoing, fentaNYL citrate PF Q1H PRN, magnesium sulfate PRN **AND** Magnesium PRN **AND** Notify Physician Ongoing, ondansetron (ZOFRAN) IV Q6H PRN, oxyCODONE Q4H PRN, potassium chloride SR PRN **OR** potassium chloride PRN      Diagnostic Tests  Hematology: Lab Results   Component Value Date    HGB 12.4 08/13/2017    HCT 37.3 08/13/2017    PLTCT 252 08/13/2017    WBC 13.4 08/13/2017    MCV 91.1 08/13/2017    MCH 30.3 08/13/2017    MCHC 33.2 08/13/2017    MPV 9.5 08/13/2017    RDW 13.1 08/13/2017         General Chemistry: Lab Results   Component Value Date    NA 142 08/13/2017    K 3.8 08/13/2017    CL 111 08/13/2017    CO2 21 08/13/2017    GAP 10 08/13/2017    BUN 13 08/13/2017    CR 0.64 08/13/2017    GLU 192 08/13/2017    CA 8.7 08/13/2017    MG 2.3 08/13/2017    PO4 3.7 08/13/2017      Coagulation:   Lab Results   Component Value Date    PTT 31.7 08/03/2017    INR 0.9 08/03/2017         Follow-Up  Assessment  Patient location during evaluation: floor      Anesthetic Complications:   Anesthetic complications: The patient did not experience any anesthestic complications.      Pain:  Score: 2    Management:adequate     Level of Consciousness: awake   Hydration:acceptable     Airway Patency: patent   Respiratory Status: ETT (40% fio2. Pt was obtunded after surgery and reintubated)     Cardiovascular Status:acceptable and stable   Regional/Neuroaxial:

## 2017-08-13 NOTE — Progress Notes
Starting at 0315 and continuing into 0400 hour, patient had two episodes of waking from restful sleep (RASS -1) to becoming very agitated (RASS +3). After further assessment only and infusing peripheral IV noted to be infiltrated. New IV established and all medications switched over to new IV. Neuro ICU notified and at bedside to assess patient. Fentanyl 50 mcg x2 given as well as increasing Precedex to 1.0 mcg/kg/hr from 0.6. Precedex later titrated down to 0.7 at 0524. Will continue to monitor and assess patient.

## 2017-08-13 NOTE — Case Management (ED)
SW stopped in pt's room x2 (12:45 and 1:30); pt busy with other disciplines and no family present.  SW to continue to check in on pt.      - S. Kathlyn [place], [place]

## 2017-08-13 NOTE — Progress Notes
This RN present during patient's extubation at noon. Pt tolerated well, VSS.     After extubation, pt failed bedside swallow. Speech c/s.

## 2017-08-13 NOTE — Progress Notes
OCCUPATIONAL THERAPY  ASSESSMENT NOTE    Patient Name: Angela Arias                   Room/Bed: ZO1096/04  Admitting Diagnosis: Meningioma (HCC) [D32.9]    Past Medical History:   Diagnosis Date   ??? Anxiety disorder    ??? Arthritis    ??? Cancer (HCC)     pancreas and stomach   ??? Depression    ??? Diverticulosis    ??? GERD (gastroesophageal reflux disease)    ??? Heart murmur    ??? HLD (hyperlipidemia)    ??? Hypertension    ??? Hypothyroidism    ??? Meningioma (HCC) 2012   ??? PONV (postoperative nausea and vomiting)    ??? Seizure (HCC)     times 1 after first craniotomy       Mobility  Progressive Mobility Level: Walk in room  Distance Walked (feet): 20 ft  Level of Assistance: Assist X1  Assistive Device: Hand Held  Time Tolerated: 11-30 minutes  Activity Limited By: Lethargy (sleepy)    Subjective  Pertinent Dx per Physician: Pt s/p BIFRONTAL CRANIOTOMY, RESECTION OF MENINGIOMA   Precautions: Falls  Pain / Complaints: Patient agrees to participate in therapy    Objective  Psychosocial Status: Willing and Cooperative to Participate  Persons Present: Physical Therapist    Home Living  Type of Home: House  Home Layout: One Level  Financial risk analyst / Tub: Psychologist, counselling  Comment: Unsure of accuracy of pt's report    Prior Function  Level Of Independence: Independent with ADLs and functional transfers;Independent with homemaking w/ ambulation  Lives With: Spouse    Vision  Comment: Unable to assess pt very sleepy this date.     ADL's  Where Assessed: Edge of Bed;Chair;Standing at [employer] Assist: Minimal Assist (standing at sink)  Grooming Deficits: Steadying;Wash/Dry Face  LE Dressing Assist: Stand By Assist (sitting in chair)  LE Dressing Deficits: Don/Doff R Sock;Don/Doff L Sock  Toileting Assist: Total Assist (foley)  Functional Transfer Assist: Total Assist (moderate assistance X2)  Comment: Pt in bed upon OT arrival.  Pt completed supine>edge of bed transfer with moderate assistance X2.  Pt demonstrating difficulty with sequencing movement to the edge of the bed.  Anticipate once pt is more alert pt would move to the edge of the bed without assistance.  Pt completed sit>stand with moderate assistance.  Pt ambulated in room with moderate assistance but able to progress to minimum assistance following grooming.   Pt required verbal cues to stop grooming activity.  Pt in chair at end of session.      Activity Tolerance  Endurance: 3/5 Tolerates 25-30 Minutes Exercise w/Multiple Rests  Sitting Balance: 3+/5 Sits w/o UE Support for 30 Seconds or Greater    Cognition  Overall Cognitive Status: Confused  Orientation: Alert & Oriented x1;To Person    Education  Persons Educated: Patient  Barriers To Learning: Cognitive Deficits  Teaching Methods: Verbal Instruction  Patient Response: Verbalized Understanding  Topics: Role of OT, Goals for Therapy  Goal Formulation: With Patient    Assessment  Assessment: Decreased Endurance;Decreased Cognition;Decreased ADL Status;Decreased Self-Care Trans  Prognosis: Fair;w/ Family  Goal Formulation: Patient    AM-PAC 6 Clicks Daily Activity Inpatient  Putting on and taking off regular lower body clothes?: A Little  Bathing (Including washing, rinsing, drying): A Lot  Toileting, which includes using toilet, bedpan, or urinal: A Lot  Putting on and taking off regular upper  body clothing: A Little  Taking care of personal grooming such as brushing teeth: A Little  Eating meals?: A Little  Daily Activity Raw Score: 16  Standardized (t-scale) score: 35.96  CMS 0-100% Score: 53.32  CMS G Code Modifier: CK    Plan   OT Frequency: 5x/week  OT Plan for Next Visit: Work on ADLs such as brushing teeth to work on sequencing.  Grooming at sink vs toileting.     ADL Goals  Patient Will Perform Grooming: Standing at Sink;w/ Stand By Assist  Patient Will Perform LE Dressing: In Chair (pants)  Patient Will Perform Toileting: w/ Minimum Assist    Functional Transfer Goals Pt Will Transfer To Toilet: w/ Minimum Assist, w/ Good Judgment/Safety    OT Discharge Recommendations  OT Discharge Recommendations: Inpatient Setting  Equipment Recommendations: Too early to be determined    G-Codes: Self-care  [phone] Current Status:  40-59% Impairment  [id number] Goal Status:  20-39% Impairment     Based on above evaluation and clinical judgment.      Therapist: Maxine Glenn , OTR/L   Date: 08/13/2017

## 2017-08-13 NOTE — Progress Notes
Pt's UOP dropped to 15-25 for several hours (see doc flowsheets). Notifed Kyra Searles, MD. Orders to increase fluids to 100 ml/hr and bolus 500 ml, see MAR.

## 2017-08-13 NOTE — Progress Notes
Neuro Critical Care Progress Note          Kristal Perl Corro  Admission Date: 08/12/2017  LOS: 1 day  Full Code                      ASSESSMENT/PLAN     Patient Active Problem List    Diagnosis Date Noted   ??? Meningioma (HCC) 07/13/2017       CARRAH EPPOLITO is a 76 y.o. female with pmh of meningioma, hypothyroidism, HTN, HLD, GERD, anxiety, Depression and arthritis who presents to [facility] s/p craniotomy with resection of meningioma 9/26  ???  Hospital and ICU course:   9/26: To OR for craniotomy for resection of meningioma, re-intubated  9/27: Possible extubation  ???  Neuro:   Meningioma s/p craniotomy and resection  Altered mental status  - Q1H neuro checks  - PT/OT  - Post op ancef x 3 doses  - Decadron  - Keppra  - Post op MRI- formal read pending- pneumocephalus  - Narcan 09/26- .04mg  x 4 doses  ???  Sedation/Pain Management:  - PRN acetaminophen, oxycodone and fentanyl   - Continue PTA Paxil  ???  Cardiac:   - SBP goal < 160  - MAP goal > 65  - Continue PTA Norvasc  ???  Respiratory:    Acute hypercarbic respiratory failure s/p craniotomy  - Maintain SpO2>92%  - Currently intubated- wean as tolerated  - pH 7.18 with pCO2 71 @ 1345 on 09/26  - Repeat ABG 9/27: 7.41/37/150/23.7   - Possible extubation 09/27  ???  GI:  - Feeding: NPO  - neurosurgery bowel regimen, ensure daily BM  ???  Heme:   - assess for coagulopathy, maintain platelets above 100k, INR <1.5  - Hgb 12.4, platelet 252  - Continue to monitor with daily CBC  ???  ID:   - Tmax afebrile   - WBC- 13.4- likely stress induced leukocytosis from recent procedure  - aim for normothermia, Temp <38.3 celsius, normothermia protocol if febrile  ???  ???  Renal:   - I/O balance +689 mL since admission  - Aim for normovolemia  ???  Endocrine:    - Blood glucose goal 100-180mg /dl  - Continue PTA synthroid  ???  FEN:   - IVF: NS@50  mL/hr  - Magnesium goal >2.0, i-Cal goal > 1.0, Potassium goal >4.0 mEq/L  - On electrolyte replacement protocol  ???  Prophylaxis Review:   A)GI: H2 blocker B) Lines:  Remove arterial line  C) Urinary Catheter:  Yes; Retain foley due to:  Need for accurate Intake and Output  D) Antibiotic Usage:  Post op ancef  E) VTE:  Mechanical prophylaxis; Sequential compression device  F) Isolation: None  G)Seizures: Keppra  I) Restraints: Patient assessed for need for restraints.   Disposition/Family:   ICU status  ???  Primary service: Neurosurgery  ???  Consults:  NEICU  ???  ___________________________________________________________________  SUBJECTIVE   Griffith Citron Barth is a 76 y.o. female.  Overnight Events: Significant event:   Patient agitated overnight due to IV infiltration.  Patient appears comfortable and following simple commands    OBJECTIVE                     Vital Signs: Last Filed                  Vital Signs: 24 Hour Range   BP: 159/76 (09/27 0500)  ABP: 105/67 (09/27 0437)  Temp: 36.8 ???C (98.2 ???F) (09/27 0400)  Pulse: 63 (09/27 0500)  Respirations: 18 PER MINUTE (09/27 0500)  SpO2: 100 % (09/27 0500)  O2 Delivery: Endotracheal Tube (Oral) (09/27 0500)  Height: 157.5 cm (62) (09/26 0723)  Weight: 66.5 kg (146 lb 9.7 oz) (09/27 0400)  BP: (87-159)/(53-76)   ABP: (83-173)/(50-73)   Temp:  [36.6 ???C (97.9 ???F)-36.9 ???C (98.5 ???F)]   Pulse:  [62-115]   Respirations:  [10 PER MINUTE-38 PER MINUTE]   SpO2:  [93 %-100 %]   O2 Delivery: Endotracheal Tube (Oral)    Intensity Pain Scale (Self Report): (not recorded) Vitals:    08/12/17 0723 08/13/17 0400   Weight: 63.6 kg (140 lb 3.2 oz) 66.5 kg (146 lb 9.7 oz)         Artificial airway:  Endotracheal Tube              Ventilator/ Respiratory Therapy:  Yes: Mode: V/IMV+  Set Vt (ml):  [420 milliliters]   Expired Tidal Volume Spont (mL):  [395 milliliters-398 milliliters]   Set RR:  [18 breaths/minutes]   Total Respiratory Rate (Breaths/Min):  [18 breaths/minutes]   Minute Volume (L/min):  [7.54 liters/minutes-7.55 liters/minutes]   O2%:  [40 %]   PIP Actual:  [16 cm H20]   PEEP/CPAP:  [5 cm H2O]   PSupport:  [5 cm H20] Vent weaning trial:  Per protocol    Lines:  Arterial Line and Peripheral Line  Drains: Foley Catheter: 1900 mL    Intake/Output Summary:  (Last 24 hours)    Intake/Output Summary (Last 24 hours) at 08/13/17 0612  Last data filed at 08/13/17 0500   Gross per 24 hour   Intake          2634.73 ml   Output             1945 ml   Net           689.73 ml                Physical Exam:    Blood pressure 159/76, pulse 63, temperature 36.8 ???C (98.2 ???F), height 157.5 cm (62), weight 66.5 kg (146 lb 9.7 oz), SpO2 100 %.    Glasgow coma score:              E: 3 - Opens eyes to loud noise or command         M: 6 - Follows simple motor commands             V: 1 - Makes no noise  FOUR score:      E: 3     M: 4      BR:4      Resp: 0 (for intubated patients)    Neuro:   Cranial Nerves:       - Pupil exam: Size:         3mm               Reactivity: Brisk OU                 - Corneal reflex: R - present L - present    - Grimace/facial movement: present     - Cough: present    - Gag reflex: present    Lungs: clear to auscultation bilaterally                     Heart: regular rate and rhythm, S1, S2 normal, no murmur,  click, rub or gallop  Abdomen: soft, non-tender. Bowel sounds normal. No masses,  no organomegaly  Extremities: no edema, redness or tenderness in the calves or thighs  Skin: Skin color, texture, turgor normal. No rashes or lesions    Point of Care Testing:  (Last 24 hours)  Glucose: (!) 192 (08/13/17 0420)  POC Glucose (Download): (!) 148 (08/12/17 1330)    Lab Review:    24-hour labs:    Results for orders placed or performed during the hospital encounter of 08/12/17 (from the past 24 hour(s))   TYPE & CROSSMATCH    Collection Time: 08/12/17  6:54 AM   Result Value Ref Range    Units Ordered 2     Crossmatch Expires 08/15/2017     Record Check FOUND     ABO/RH(D) O POS     Antibody Screen NEG     Electronic Crossmatch YES    POC GLUCOSE    Collection Time: 08/12/17  1:30 PM   Result Value Ref Range Glucose, POC 148 (H) 70 - 100 MG/DL   POC BLOOD GAS ARTERIAL    Collection Time: 08/12/17  1:36 PM   Result Value Ref Range    PH-ART-POC 7.18 (LL) 7.35 - 7.45    PCO2-ART-POC 71 (HH) 35 - 45 MMHG    PO2-ART-POC 138 (H) 80 - 100 MMHG    Base Def-ART-POC 2.0 MMOL/L    O2 Sat-ART-POC 98.0 95 - 99 %    Bicarbonate-ART-POC 26.3 21 - 28 MMOL/L   POC HEMATOCRIT    Collection Time: 08/12/17  1:36 PM   Result Value Ref Range    Hemoglobin POC 14.6 12.0 - 15.0 GM/DL    Hematocrit POC 16.1 36 - 45 %   POC POTASSIUM    Collection Time: 08/12/17  1:36 PM   Result Value Ref Range    Potassium-POC 3.5 3.5 - 5.1 MMOL/L   POC SODIUM    Collection Time: 08/12/17  1:36 PM   Result Value Ref Range    Sodium-POC 143 137 - 147 MMOL/L   POC IONIZED CALCIUM    Collection Time: 08/12/17  1:36 PM   Result Value Ref Range    Ionized Calcium-POC 1.08 1.0 - 1.3 MMOL/L   POC BLOOD GAS ARTERIAL    Collection Time: 08/12/17  2:24 PM   Result Value Ref Range    PH-ART-POC 7.21 (L) 7.35 - 7.45    PCO2-ART-POC 66 (H) 35 - 45 MMHG    PO2-ART-POC 107 (H) 80 - 100 MMHG    Base Def-ART-POC 1.0 MMOL/L    O2 Sat-ART-POC 97.0 95 - 99 %    Bicarbonate-ART-POC 26.5 21 - 28 MMOL/L   POC BLOOD GAS ARTERIAL    Collection Time: 08/12/17  3:05 PM   Result Value Ref Range    PH-ART-POC 7.20 (L) 7.35 - 7.45    PCO2-ART-POC 71 (HH) 35 - 45 MMHG    PO2-ART-POC 113 (H) 80 - 100 MMHG    Base Def-ART-POC 1.0 MMOL/L    O2 Sat-ART-POC 97.0 95 - 99 %    Bicarbonate-ART-POC 27.4 21 - 28 MMOL/L   POC BLOOD GAS ARTERIAL    Collection Time: 08/12/17  3:42 PM   Result Value Ref Range    PH-ART-POC 7.24 (L) 7.35 - 7.45    PCO2-ART-POC 66 (H) 35 - 45 MMHG    PO2-ART-POC 124 (H) 80 - 100 MMHG    Base Ex-ART-POC 0.0 MMOL/L    O2 Sat-ART-POC 98.0 95 -  99 %    Bicarbonate-ART-POC 27.9 21 - 28 MMOL/L   BLOOD GASES, ARTERIAL    Collection Time: 08/12/17  4:43 PM   Result Value Ref Range    pH-Arterial 7.34 (L) 7.35 - 7.45    pCO2-Arterial 44 35 - 45 MMHG pO2-Arterial 153 (H) 80 - 100 MMHG    Base Deficit-Arterial 1.8 MMOL/L    O2 Sat-Arterial 97.7 95 - 99 %    Bicarbonate-ART-Cal 22.9 21 - 28 MMOL/L   BASIC METABOLIC PANEL    Collection Time: 08/13/17  4:20 AM   Result Value Ref Range    Sodium 142 137 - 147 MMOL/L    Potassium 3.8 3.5 - 5.1 MMOL/L    Chloride 111 (H) 98 - 110 MMOL/L    CO2 21 21 - 30 MMOL/L    Anion Gap 10 3 - 12    Glucose 192 (H) 70 - 100 MG/DL    Blood Urea Nitrogen 13 7 - 25 MG/DL    Creatinine 1.61 0.4 - 1.00 MG/DL    Calcium 8.7 8.5 - 09.6 MG/DL    eGFR Non African American >60 >60 mL/min    eGFR African American >60 >60 mL/min   CBC    Collection Time: 08/13/17  4:20 AM   Result Value Ref Range    White Blood Cells 13.4 (H) 4.5 - 11.0 K/UL    RBC 4.10 4.0 - 5.0 M/UL    Hemoglobin 12.4 12.0 - 15.0 GM/DL    Hematocrit 04.5 36 - 45 %    MCV 91.1 80 - 100 FL    MCH 30.3 26 - 34 PG    MCHC 33.2 32.0 - 36.0 G/DL    RDW 40.9 11 - 15 %    Platelet Count 252 150 - 400 K/UL    MPV 9.5 7 - 11 FL   MAGNESIUM    Collection Time: 08/13/17  4:20 AM   Result Value Ref Range    Magnesium 2.3 1.6 - 2.6 mg/dL   PHOSPHORUS    Collection Time: 08/13/17  4:20 AM   Result Value Ref Range    Phosphorus 3.7 2.0 - 4.5 MG/DL   BLOOD GASES, ARTERIAL    Collection Time: 08/13/17  4:20 AM   Result Value Ref Range    pH-Arterial 7.41 7.35 - 7.45    pCO2-Arterial 37 35 - 45 MMHG    pO2-Arterial 150 (H) 80 - 100 MMHG    Base Deficit-Arterial 0.8 MMOL/L    O2 Sat-Arterial 97.8 95 - 99 %    Bicarbonate-ART-Cal 23.7 21 - 28 MMOL/L       Radiology and Other Diagnostic Procedures Review:  Pertinent radiologic and diagnostic procedures reviewed.      Mills Koller, MD Date:  08/13/2017   [phone] [facility] Pager  [phone]    Patient seen and discussed with Dr. Cherlynn Kaiser

## 2017-08-13 NOTE — Progress Notes
SPEECH-LANGUAGE PATHOLOGY  NO TREATMENT NOTE     Orders received and appreciated for clinical swallow evaluation. Chart reviewed and SLP attempted to see. Pt sleeping on SLP arrival and family at bedside requests that SLP return at a later time to allow pt to rest. SLP will return to complete evaluation as ordered.     Chart review: Angela Arias a 76 y.o.femalewith pmh of meningioma, hypothyroidism, HTN, HLD, GERD, anxiety, Depression and arthritis who presents to [facility] s/p craniotomy with resection of meningioma 9/26  MRI head 9/27 impressions:   1. Bifrontal craniotomy and left anterior perifalcine meningioma resection without evidence of residual tumor.  2. Mild to moderate postprocedural pneumocephalus and residual left frontal vasogenic edema associated localized mass effect and mild   left-to-right frontal midline shift.  3. 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    Chest single view 9/26 impressions  ET tube placement as described. No evidence of CHF or pneumonia.    Therapist: Serena Croissant, MS,CCC-SLP [phone]  Date: 08/13/2017

## 2017-08-13 NOTE — Progress Notes
Neurosurgery Progress Note      Admission Date: 08/12/2017 LOS: 1 day  ______________________________________________________________    Subjective: No acute events noted, seen with Neurosurgery team, later in AM with Dr. Francis Gaines. Husband at beside, updated on plan for extubation today.       Objective:  Sedation paused, took approximate 10-15 minute to participate  Opens eyes to name  Initially followed briskly on the left side only, with some agitation with suctioning localizing with RUE, and then followed commands with squeezing and wiggling toes.  Mouthed OK around ETT  PERRL  Dressing in place      A/P: Angela Arias is a 76 y.o. female with Meningioma (HCC) [D32.9]  Meningioma (HCC) [D32.9]   Patient Active Problem List    Diagnosis Date Noted   ??? Meningioma (HCC) 07/13/2017       Neuro: Neurologically stable, slow to wake after surgery yesterday, sedated upon arrival to floor, required reintubation. Overnight with improved exam F/C in all extremities. Work towards extubation today, discussed with bedside nurse to stop sedation. Post-op MRI reviewed, without residual, noted pneumocephalus. PT/OT once able. Dexamethasone, and Keppra.  Pulmonary: Intubated on vent, extubate as able.  CV: Maintain normotension  GI: NPO  FEN: Maintain euvolemia, Na 142  ID: Afebrile, WBC 13.4-steroid induced leukocytosis  Heme: Stable  Disposition/Family:  Continue ICU care      Prophylaxis:   A)GI: PPI  B) Lines:  Yes; Central Line; Indication:  Frequent blood draws and Hemodynamic monitoring; Type:  Subclavian, triple lumen  C) Urinary Catheter:  Yes; Retain foley due to:  Need for accurate Intake and Output  D) Antibiotic Usage:  No  E) VTE:  Mechanical prophylaxis; Sequential compression device No anticoagulation until 48 hours post operative; contraindication due to bleeding risk  F) Restraints: Patient assessed for need for restraints.     Please call [phone] with any questions.    Blima Rich, APRN Pager [phone]

## 2017-08-14 ENCOUNTER — Encounter: Admit: 2017-08-14 | Discharge: 2017-08-14 | Payer: MEDICARE

## 2017-08-14 DIAGNOSIS — R011 Cardiac murmur, unspecified: ICD-10-CM

## 2017-08-14 DIAGNOSIS — R112 Nausea with vomiting, unspecified: ICD-10-CM

## 2017-08-14 DIAGNOSIS — F419 Anxiety disorder, unspecified: ICD-10-CM

## 2017-08-14 DIAGNOSIS — E039 Hypothyroidism, unspecified: ICD-10-CM

## 2017-08-14 DIAGNOSIS — F329 Major depressive disorder, single episode, unspecified: ICD-10-CM

## 2017-08-14 DIAGNOSIS — R569 Unspecified convulsions: ICD-10-CM

## 2017-08-14 DIAGNOSIS — E785 Hyperlipidemia, unspecified: ICD-10-CM

## 2017-08-14 DIAGNOSIS — D329 Benign neoplasm of meninges, unspecified: ICD-10-CM

## 2017-08-14 DIAGNOSIS — K5790 Diverticulosis of intestine, part unspecified, without perforation or abscess without bleeding: ICD-10-CM

## 2017-08-14 DIAGNOSIS — M1990 Unspecified osteoarthritis, unspecified site: ICD-10-CM

## 2017-08-14 DIAGNOSIS — I10 Essential (primary) hypertension: ICD-10-CM

## 2017-08-14 DIAGNOSIS — C801 Malignant (primary) neoplasm, unspecified: Principal | ICD-10-CM

## 2017-08-14 DIAGNOSIS — K219 Gastro-esophageal reflux disease without esophagitis: ICD-10-CM

## 2017-08-14 LAB — PHOSPHORUS: Lab: 2.5 mg/dL — ABNORMAL LOW (ref >60)

## 2017-08-14 LAB — CBC: Lab: 22 K/UL — ABNORMAL HIGH (ref 4.5–11.0)

## 2017-08-14 LAB — POC GLUCOSE
Lab: 141 mg/dL — ABNORMAL HIGH (ref 70–100)
Lab: 126 mg/dL — ABNORMAL HIGH (ref 70–100)

## 2017-08-14 LAB — MAGNESIUM: Lab: 2.2 mg/dL — ABNORMAL LOW (ref >60)

## 2017-08-14 LAB — BASIC METABOLIC PANEL: Lab: 141 MMOL/L — ABNORMAL LOW (ref 137–147)

## 2017-08-14 MED ORDER — HEPARIN, PORCINE (PF) 5,000 UNIT/0.5 ML IJ SYRG
5000 [IU] | SUBCUTANEOUS | 0 refills | Status: DC
Start: 2017-08-14 — End: 2017-08-17
  Administered 2017-08-15 – 2017-08-17 (×8): 5000 [IU] via SUBCUTANEOUS

## 2017-08-14 MED ORDER — DEXAMETHASONE 4 MG PO TAB
4 mg | ORAL | 0 refills | Status: DC
Start: 2017-08-14 — End: 2017-08-16
  Administered 2017-08-14 – 2017-08-16 (×9): 4 mg via ORAL

## 2017-08-14 NOTE — Progress Notes
Neuro Critical Care Progress Note          Kalayna Darpino Arias  Admission Date: 08/12/2017  LOS: 2 days  Full Code                      ASSESSMENT/PLAN     Patient Active Problem List    Diagnosis Date Noted   ??? Meningioma (HCC) 07/13/2017       Angela Arias???is a 76 y.o.???female???with pmh of meningioma, hypothyroidism, HTN, HLD, GERD, anxiety, Depression and arthritis who presents to [facility] s/p craniotomy with resection of meningioma 9/26 (POD#2)  ???  Hospital and ICU course:???  9/26: To OR for craniotomy for resection of meningioma, re-intubated  9/27: Extubation  9/28: Recommend downgrade to floor status  ???  Neuro:   Meningioma s/p craniotomy and resection  Altered mental status  - Q1H neuro checks  - PT/OT  - Post op ancef x 3 doses- finished  - Decadron  - Keppra  - Post op MRI  - Narcan 09/26- .04mg  x 4 doses  ???  Sedation/Pain Management:  - PRN acetaminophen, oxycodone???and fentanyl   - Acetaminophen x 1 last 24 hours  - Continue PTA Paxil  ???  Cardiac:   - SBP goal <???160  - MAP goal > 65  - Continue PTA Norvasc  ???  Respiratory: ???  Acute hypercarbic respiratory failure s/p craniotomy- resolved  - Maintain SpO2>92%  - Extubated 9/27, currently on room air  - pH 7.18 with pCO2 71 @ 1345 on 09/26  - Repeat ABG 9/27: 7.41/37/150/23.7   ???  GI:  - Feeding: Full liquid diet  - neurosurgery bowel regimen, ensure daily BM  - Speech to evaluate swallow  ???  Heme:   - assess for coagulopathy, maintain platelets above 100k, INR <1.5  - Hgb 12.7, platelet 233  - Continue to monitor with daily CBC  ???  ID:   - Tmax afebrile???  - WBC- 22.8- likely steroid induced leukocytosis  - aim for normothermia, Temp <38.3 celsius, normothermia protocol if febrile  ???  ???  Renal:   - I/O balance +1317 mL since admission  - BUN/Cr 14/.44  - Aim for normovolemia  ???  Endocrine: ???  - Blood glucose goal 100-180mg /dl  - Continue PTA synthroid  ???  FEN:???  - IVF:???NS@100  mL/hr- will discontinue if tolerating PO intake - Magnesium goal >2.0, i-Cal goal > 1.0, Potassium goal >4.0 mEq/L  - On electrolyte replacement protocol  ???  Prophylaxis Review:   A)GI:???H2 blocker  B) Lines:??????Remove arterial line  C) Urinary Catheter:??????Recommend removing foley catheter  D) Antibiotic Usage:??????Post op ancef- finished  E) VTE:??????Mechanical prophylaxis; Sequential compression device  F) Isolation:???None  G)Seizures:???Keppra  I) Restraints: Patient assessed for need for restraints.   Disposition/Family:??????Recommend downgrade to floor status  ???  Primary service:???Neurosurgery  ???  Consults: ???NEICU  ___________________________________________________________________  SUBJECTIVE   Angela Arias is a 76 y.o. female.  Overnight Events: No new events noted.  Patient reports getting rest overnight, denies fevers, chills, nausea, vomiting, dysuria, chest pain, cough, sore throat, shortness of breath.    OBJECTIVE                     Vital Signs: Last Filed                  Vital Signs: 24 Hour Range   BP: 138/63 (09/28 0500)  ABP: 88/56 (09/27 1154)  Temp: 36.4 ???C (  97.6 ???F) (09/28 0400)  Pulse: 82 (09/28 0500)  Respirations: 12 PER MINUTE (09/28 0500)  SpO2: 94 % (09/28 0500)  O2 Delivery: None (Room Air) (09/28 0500)  Weight: 66.6 kg (146 lb 13.2 oz) (09/28 0100)  BP: (94-152)/(44-97)   ABP: (88-153)/(56-84)   Temp:  [36.4 ???C (97.6 ???F)-36.9 ???C (98.5 ???F)]   Pulse:  [58-90]   Respirations:  [9 PER MINUTE-25 PER MINUTE]   SpO2:  [92 %-100 %]   O2 Delivery: None (Room Air)    Intensity Pain Scale (Self Report): (not recorded) Vitals:    08/12/17 0723 08/13/17 0400 08/14/17 0100   Weight: 63.6 kg (140 lb 3.2 oz) 66.5 kg (146 lb 9.7 oz) 66.6 kg (146 lb 13.2 oz)         Artificial airway:  None              Ventilator/ Respiratory Therapy:  No   Vent weaning trial:  Not applicable    Lines:  Peripheral Line  Drains: Foley Catheter: 1425 mL    Intake/Output Summary:  (Last 24 hours)    Intake/Output Summary (Last 24 hours) at 08/14/17 0622 Last data filed at 08/14/17 0500   Gross per 24 hour   Intake          2608.84 ml   Output             1220 ml   Net          1388.84 ml                Physical Exam:    Blood pressure 138/63, pulse 82, temperature 36.4 ???C (97.6 ???F), height 157.5 cm (62), weight 66.6 kg (146 lb 13.2 oz), SpO2 94 %.    Glasgow coma score:              E: 4 - Opens eyes on own         M: 6 - Follows simple motor commands             V: 5 - Alert and oriented    Neuro:   Mental Status: alert and oriented to person, place, situation, not time   Cranial Nerves: Cranial nerves 2-12 INTACT.      - Pupil exam: Size:         4mm               Reactivity: Brisk OU    - EOM: intact                 - Corneal reflex: R - present L - present    - Grimace/facial movement: present     - Cough: present    - Gag reflex: present   Motor:         RUE: Strength: 5/5; follows commands                    RLE: Strength: 5/5; follows commands            LUE: Strength: 5/5; follows commands         LLE: Strength: 5/5; follows commands   Sensory: normal   Coordination: wnl    Lungs: clear to auscultation bilaterally                     Heart: regular rate and rhythm, S1, S2 normal, no murmur, click, rub or gallop  Abdomen: soft, non-tender. Bowel sounds normal. No masses,  no  organomegaly  Extremities: no edema, redness or tenderness in the calves or thighs  Skin: Skin color, texture, turgor normal. No rashes or lesions    Point of Care Testing:  (Last 24 hours)  Glucose: (!) 126 (08/14/17 0417)  POC Glucose (Download): (!) 126 (08/14/17 0419)    Lab Review:    24-hour labs:    Results for orders placed or performed during the hospital encounter of 08/12/17 (from the past 24 hour(s))   POC GLUCOSE    Collection Time: 08/13/17  8:33 AM   Result Value Ref Range    Glucose, POC 157 (H) 70 - 100 MG/DL   POC GLUCOSE    Collection Time: 08/13/17 12:33 PM   Result Value Ref Range    Glucose, POC 124 (H) 70 - 100 MG/DL   BASIC METABOLIC PANEL Collection Time: 08/13/17  2:11 PM   Result Value Ref Range    Sodium 141 137 - 147 MMOL/L    Potassium 3.5 3.5 - 5.1 MMOL/L    Chloride 111 (H) 98 - 110 MMOL/L    CO2 23 21 - 30 MMOL/L    Anion Gap 7 3 - 12    Glucose 138 (H) 70 - 100 MG/DL    Blood Urea Nitrogen 16 7 - 25 MG/DL    Creatinine 1.91 0.4 - 1.00 MG/DL    Calcium 8.2 (L) 8.5 - 10.6 MG/DL    eGFR Non African American >60 >60 mL/min    eGFR African American >60 >60 mL/min   POC GLUCOSE    Collection Time: 08/13/17  5:25 PM   Result Value Ref Range    Glucose, POC 111 (H) 70 - 100 MG/DL   POC GLUCOSE    Collection Time: 08/13/17  8:30 PM   Result Value Ref Range    Glucose, POC 141 (H) 70 - 100 MG/DL   BASIC METABOLIC PANEL    Collection Time: 08/14/17  4:17 AM   Result Value Ref Range    Sodium 141 137 - 147 MMOL/L    Potassium 4.3 3.5 - 5.1 MMOL/L    Chloride 113 (H) 98 - 110 MMOL/L    CO2 20 (L) 21 - 30 MMOL/L    Anion Gap 8 3 - 12    Glucose 126 (H) 70 - 100 MG/DL    Blood Urea Nitrogen 14 7 - 25 MG/DL    Creatinine 4.78 0.4 - 1.00 MG/DL    Calcium 8.5 8.5 - 29.5 MG/DL    eGFR Non African American >60 >60 mL/min    eGFR African American >60 >60 mL/min   CBC    Collection Time: 08/14/17  4:17 AM   Result Value Ref Range    White Blood Cells 22.8 (H) 4.5 - 11.0 K/UL    RBC 4.17 4.0 - 5.0 M/UL    Hemoglobin 12.7 12.0 - 15.0 GM/DL    Hematocrit 62.1 36 - 45 %    MCV 92.6 80 - 100 FL    MCH 30.5 26 - 34 PG    MCHC 32.9 32.0 - 36.0 G/DL    RDW 30.8 11 - 15 %    Platelet Count 233 150 - 400 K/UL    MPV 9.3 7 - 11 FL   MAGNESIUM    Collection Time: 08/14/17  4:17 AM   Result Value Ref Range    Magnesium 2.2 1.6 - 2.6 mg/dL   PHOSPHORUS    Collection Time: 08/14/17  4:17 AM   Result Value  Ref Range    Phosphorus 2.5 2.0 - 4.5 MG/DL   POC GLUCOSE    Collection Time: 08/14/17  4:19 AM   Result Value Ref Range    Glucose, POC 126 (H) 70 - 100 MG/DL       Radiology and Other Diagnostic Procedures Review:  Pertinent radiologic and diagnostic procedures reviewed. Mills Koller, MD Date:  08/14/2017   [phone] [facility] Pager  PGY-1    Patient seen and discussed with Dr. Cherlynn Kaiser

## 2017-08-14 NOTE — Progress Notes
Neurosurgery Progress Note      Admission Date: 08/12/2017 LOS: 2 days  ______________________________________________________________    S: No acute events noted, has done well since extubation yesterday afternoon. Husband at bedside. Patient without complaints, updated on MRI findings, and plan for the day. Seen later in AM with Dr. Enrique Sack.        O:                  Vital Signs: 24 Hour Range   BP: (94-148)/(44-97)   ABP: (88-115)/(56-71)   Temp:  [36.4 C (97.6 F)-36.9 C (98.5 F)]   Pulse:  [58-92]   Respirations:  [9 PER MINUTE-25 PER MINUTE]   SpO2:  [92 %-100 %]   O2 Delivery: None (Room Air)     Physical Exam:  Alert and oriented x 4  PERRL  MAE, follows commands  Speech clear, fluent  Strength at bed level grossly 5/5  Dressing removed, incision with staples, C/D/I without redness or drainage.      A/P: 76 y.o. female   Active Problems:    Meningioma ([place])    OR 08/12/17 Bifrontal craniotomy for resection of recurrent falcine meningioma    Progress to floor status  PT/OT following, recommend inpatient rehab, Rehab consult placed.  SLP following after re-intubation for swallow, to see again today, currently NPO. May need corpak if still without a diet later today.  Na 141  Dex, Keppra  IVF NS through this afternoon.   Dispo plan evolving.        Prophylaxis:   A) GI: PPI  B) Lines:  No  C) Urinary Catheter:  No  D) Antibiotic Usage:  No  E) VTE:  Pharmacological prophylaxis; SQ Heparin and Mechanical prophylaxis; Sequential compression device  F) Restraints: Patient assessed for need for restraints.     Please call [phone] with any questions.    Delfin Gant, APRN  Pager [phone]

## 2017-08-14 NOTE — Case Management (ED)
CMA Note:       Received request from [facility] to fax referrals to the following placements.    [place]  Case Management Assistant  For additional assistance please contact [place]

## 2017-08-14 NOTE — Case Management (ED)
Case Management Admission Assessment    NAME:Angela Arias                          MRN: [id number]             DOB:07-Sep-1941          AGE: 76 y.o.  ADMISSION DATE: 08/12/2017             DAYS ADMITTED: LOS: 2 days      Today???s Date: 08/14/2017    Source of Information: Angela Arias and her husband, Angela Arias     Plan  Plan: CM Assessment, Discharge Planning for Facility Anticipated   Angela Arias was admitted for Meningioma tumor removal. She is doing relatively well post surgery.     Angela Arias lives at home with her husband, Angela Arias.  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  They have a daughter who lives in the [place] area.      CM: Angela Arias is anticipated to need some short stay rehab before returning home.  She and her husband prefer to go to [facility].  SW called to ask about bed availability and they stated that they have beds now, but SW will just need to check in on Monday regarding what happened through the weekend.  SW did have the CMA send an initial referral to the hospital.    [facility]  [address]  [place] [address]  [phone] (Case Management)  [phone]      Patient Address/Phone  [address] [place] [address]  [phone] (home)     Emergency Contact  Extended Emergency Contact Information  Primary Emergency Contact: Angela Arias [place]  Home Phone: [phone]  Mobile Phone: [phone]  Relation: Daughter  Secondary Emergency Contact: Angela Arias,Angela Arias   [place]  Mobile Phone: [phone]  Relation: Spouse    Healthcare Directive  Healthcare Directive: No, patient does not have a healthcare directive  Would patient like to fill out a (a new) Editor, commissioning?: No, patient declined      Transportation  Does the patient need discharge transport arranged?: No Transportation Name, Phone and Availability #1: Pt's husband can transport once she is medically stable for d/c  Does the patient use Medicaid Transportation?: No    Expected Discharge Date  Expected Discharge Date: 08/19/17    Living Situation Prior to Admission  ? Living Arrangements  Type of Residence: Home, independent  Living Arrangements: Spouse/significant other  Financial risk analyst / Tub: Psychologist, counselling, Tub/Shower Unit  How many levels in the residence?: 2  Can patient live on one level if needed?: Yes  Does residence have entry and/or side stairs?: Yes  Assistance needed prior to admit or anticipated on discharge: No  Who provides assistance or could if needed?: Pt's husband can assist, if needed  Are they in good health?: Yes  Can support system provide 24/7 care if needed?: Yes  ? Level of Function   Prior level of function: Independent  ? Cognitive Abilities   Cognitive Abilities: Alert and Oriented, Continue to Assess    Financial Resources  ? Coverage  Primary Insurance: Medicare  Secondary Insurance: Medicare Supplement  Additional Coverage: RX    ? Source of Income   Source Of Income: Other retirement income  ? Financial Assistance Needed?  Psychosocial Needs  ? Mental Health  Mental Health History: No  ? Substance Use History  Substance Use History Screen: No  ? Other      Current/Previous Services  ? PCP  Angela Arias, [phone], [phone]  ? Pharmacy  No Pharmacies Listed  ? Durable Psychologist, educational at home: Crutches, Paediatric nurse, [employer] [place], [place]  ? Home Health  Receiving home health: In the past  Agency name: Pt cannot remember, 6 years ago  Would patient use this agency again?: Yes  ? Hemodialysis or Peritoneal Dialysis  Undergoing hemodialysis or peritoneal dialysis: No  ? Tube/Enteral Feeds  Receive tube/enteral feeds: No  ? Infusion  Receive infusions: No  ? Private Duty  Private duty help used: No  ? Home and Community Based Services Home and community based services: No  ? [employer]: N/A  ? Hospice  Hospice: No  ? Outpatient Therapy  PT: No  OT: No  SLP: No  ? Skilled Nursing Facility/Nursing Home  SNF: No  NH: No  ? Inpatient Rehab  IPR: No  ? Long-Term Acute Care Hospital  LTACH: No  ? Acute Hospital Stay  Acute Hospital Stay: No     S. Terese Door, LMSW *[phone]

## 2017-08-14 NOTE — Progress Notes
SPEECH-LANGUAGE PATHOLOGY  DAILY TREATMENT NOTE      Patient seen 1x this date.  Documentation reflects all daily treatment sessions.    SUMMARY OF THERAPY SESSION: pt seen for ongoing assessment of swallow. Pt tolerated small sips thins and purees with no overt s/s aspiration. cognition appears to be improved, though may still benefit from evaluation. SLP will continue to follow along    RECOMMENDATIONS  Full liquid diet  Swallow precautions: 100% supervision, small bites/sips, slow rate  Excellent oral care  PO meds crushed in purees  SLP will follow for dysphagia management. Will complete cognitive communication evaluation. Ongoing SLP at next level of care.     Goal : Pt will participate in ongoing assessment of swallow given minimal cues to participate.   Met  Comment: pt significantly more alert today. Pt presented ice chips via spoon, thins via spoon/cup/straw, purees via spoon. Pt with adequate bolus procurement and manipulation/control. AP transfer more organized. Swallow initiation appeared to be timelier. No overt s/s aspiration with small bites/sips thins and purees. Pt with intermittent dry throat clear before, during, after trials, did not appear to be associated with swallow. No dysarthria noted and strong vocal quality.    Continue to address this goal    Goal : Pt will tolerate ice chip protocol with less than 10% overt s/s aspiration and free from respiratory status changes.   Met  Comment: minimal ice chips presented per report but tolerated well   Continue to address this goal     New goal: Pt will tolerate full liquid diet with less than 10% overt s/s aspiration and free from respiratory status changes.     Goal : Pt will participate in cognitive communication evaluation given minimal cues.   Not addressed  Comment: to be addressed  Continue to address this goal    PLAN / RECOMMENDATIONS:  Continue treatment 3x/week and Patient would benefit from further speech therapy post acute hospitalization.    Therapist: Margit Banda, MS,CCC-SLP [phone]  Date: 08/14/2017

## 2017-08-14 NOTE — Progress Notes
SPEECH-LANGUAGE PATHOLOGY  COGNITIVE-COMMUNICATION ASSESSMENT    EVALUATION SUMMARY  Pt seen for ongoing assessment of swallow and cognitive communication and language evaluation utilizing the [facility] Cognitive Assessment [facility]) as well as other informal measures. Pt scored /30, which indicates a moderate/severe  impairment. Areas of difficulty identified in the [facility] included: visual-spatial/executive function, naming, memory/delayed recall, attention, language, and orientation. Language assessment reveals higher level word finding, thought formulation, and verbal organization. Will benefit from additional assessment. Would anticipate components of both cognition and language impairment.   Please see further details below.     RECOMMENDATIONS  Advance diet as tolerated  Swallow precautions: intermittent supervision, small bites/sips, slow rate  Excellent oral care  PO meds as tolerated  SLP will follow for dysphagia and cognitive-communication/language management. Ongoing SLP at next level of care.   Consistent supervision recommended upon discharge as anticipate patient's impaired memory & attention will potentially impact their safety.  Recommend assist w/ finance & medication management and meal preparation upon discharge secondary to patient's impaired attention and/or memory.     Prognosis: good  Plan: SLP will follow at 3-5x/wk for dysphagia and cognitive communication/language management. Ongoing SLP at next level of care  Results Reported to Physician: yes via EMR    PRAGMATICS   Comments*: Appropriate eye contact and affect for the situation.     BEHAVIOR  Comments*: Increased processing time and need for repetition of information noted. Anticipate required repetition also associated with decreased hearing acuity    AUDITORY COMPREHENSION   Comments*: No focal language deficits noted.     ORIENTATION  Comments*: pt independently oriented to person and place. Regarding temporal orientation, pt missed day (stated Monday for Friday), date (18th for 28th). She did receive credit for month and year. Pt with reduced insight/awareness    VERBAL PROBLEM SOLVING  Comments: Functional problem solving: pt identified the existence of a problem with minimal cues. She generated appropriate solutions with minimal/moderate cues. She independently stated emergency number and demonstrated appropriate dialing.     VERBAL EXPRESSION: confrontational naming 100% accuracy. Verbal organization and thought formulation noted to be reduced. She completed open ended sentences with no cues.     AUDITORY COMPREHENSION: pt receptively identified items/pictures with no cues. She followed 1-2 step commands with no cues. She demonstrated understanding at social conversation level.     Montreal Cognitive Assessment (MoCA), Version 7.1    Subtest / MOCA item  Patient Score Norm (SD)  Comments    Visuospatial/   Executive   Function Trails  0/1  0.87 (.34)   1a-2-b-3-4-c-d-5-e    Cube  0/1  0.71 (.46)       Clock  1/3  2.65 (.65)  Achieved one point for contour, missed points for numbers and drawing hands on clock     Naming  2/3  2.88 (.36)  Missed point for rhino   Attention  Digit Span  1/2  1.82 (.44)  Missed one point for reverse digit span    Letter A  0/1  0.97 (0.18)  Multiple false positives and false negatives    Serial 7  0/3  2.89 (.041)  No accurate calculations   Language  Sentence Repetition  0/2  1.83 (.37)       Fluency  0/1  0.87 (.34)  Pt named 1 items     Abstraction  0/2  1.83 (.43)       Delayed Recall  0/5  3.73 (1.27)  Immediate recall following multiple rehearsals with  4/5 accuracy. Delayed recall 0/5     Orientation  4/6  5.99 (.11)  Missed points for date, day     Total  8/30  20.35(4.91) Normative data from Tedra Coupe al., 2011        Objective*  Relevant Med Background: Angela Arias is a 76 y.o. female with pmh of meningioma, hypothyroidism, HTN, HLD, GERD, anxiety, Depression and arthritis who presents to [facility] s/p craniotomy with resection of meningioma 9/26    MRI head 9/27 impressions:   1. Bifrontal craniotomy and left anterior perifalcine meningioma resection without evidence of residual tumor.  2. Mild to moderate postprocedural pneumocephalus and residual left frontal vasogenic edema associated localized mass effect and mild   left-to-right frontal midline shift.  3. 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  ???  Chest single view 9/26 impressions  ET tube placement as described. No evidence of CHF or pneumonia.    Lives With: Spouse  Psychosocial Status: Willing and Cooperative to Participate  Persons Present: Spouse    Subjective*  Pain: Patient has no complaint of pain  Trach Presence: No  Feeding Tube Present During Eval: None    Education*  Persons Educated: Pt/Family  Barriers To Learning: Cognitive Deficits, Hearing  Interventions: Family Educated, Haematologist Educated  Teaching Methods: Verbal  Topics: Dysphagia, Memory  Patient Response: Verbalized Understanding  Goal Formulation: With Pt/Family    Cognitive Goals*  Goal : Pt will participate in ongoing assessment of cognitive communication and language skills given minimal cues to participate.   Goal : Pt will recall 3/5 pieces of functional salient information regarding recent events with moderate cues.   Goal : Pt will compare/contrast items in concrete categories targeting word finding, thought formulation, and verbal organization with moderate cues.    Goal : Pt will participate in ongoing assessment of swallow given minimal cues to participate.   Met  Comment: Pt presented ice chips via spoon, thins via cup/straw, purees via spoon, regular dissolvable hard solids. Pt with adequate bolus procurement and manipulation/control. Mastication WFL. AP transfer more organized. Swallow initiation appeared to be timelier. No overt s/s aspiration across consistencies.   Continue to address this goal  ???  Goal : Pt will tolerate ice chip protocol with less than 10% overt s/s aspiration and free from respiratory status changes.   Met  Comment: minimal ice chips presented per report but tolerated well   DC goal as met  ???  Goal: Pt will tolerate full liquid diet with less than 10% overt s/s aspiration and free from respiratory status changes.   Met    New goal: pt will tolerate least restrictive diet with less than 10% overt s/s aspiration and free from respiratory status changes.   ???  Goal : Pt will participate in cognitive communication evaluation given minimal cues.   Goal met. See above    Therapist: Margit Banda, MS,CCC-SLP [phone]  Date: 08/14/2017

## 2017-08-15 LAB — CBC: Lab: 16 K/UL — ABNORMAL HIGH (ref 4.5–11.0)

## 2017-08-15 LAB — BASIC METABOLIC PANEL: Lab: 142 MMOL/L — ABNORMAL LOW (ref 137–147)

## 2017-08-15 LAB — MAGNESIUM: Lab: 2 mg/dL — ABNORMAL HIGH (ref >60)

## 2017-08-15 LAB — PHOSPHORUS: Lab: 1.9 mg/dL — ABNORMAL LOW (ref >60)

## 2017-08-15 NOTE — Progress Notes
I have reviewed the notes, assessment, and/or procedures performed by Medi, RN and concur with her documentation unless otherwise noted.

## 2017-08-15 NOTE — Progress Notes
Neurosurgery Progress Note      Admission Date: 08/12/2017 LOS: 3 days  ______________________________________________________________    S: No acute events noted.      O:                  Vital Signs: 24 Hour Range   BP: (137-162)/(63-81)   Temp:  [36.8 C (98.2 F)-36.9 C (98.4 F)]   Pulse:  [75-92]   Respirations:  [16 PER MINUTE-20 PER MINUTE]   SpO2:  [94 %-97 %]   O2 Delivery: None (Room Air)     Awake, alert, oriented  Follows commands x 4 extremities, good strength  Incision CDI without redness or drainage      A/P: 76 y.o. female   Active Problems:    Meningioma ([facility])    OR 08/12/17 Bifrontal craniotomy for resection of recurrent falcine meningioma    Progress to floor status  PT/OT following, recommend inpatient rehab, Rehab consult placed. Swing bed DC plan.  Dex, Keppra        Prophylaxis:   A) GI: PPI  B) Lines:  No  C) Urinary Catheter:  No  D) Antibiotic Usage:  No  E) VTE:  Pharmacological prophylaxis; SQ Heparin and Mechanical prophylaxis; Sequential compression device  F) Restraints: Patient assessed for need for restraints.     Please call [phone] with any questions.    Derry Lory, MD  Pager [phone]

## 2017-08-15 NOTE — Progress Notes
PHYSICAL THERAPY  PROGRESS NOTE     MOBILITY:  Progressive Mobility Level: Walk laps  Distance Walked (feet): 620 ft  Level of Assistance: Assist X1 (contact guard assist)  Assistive Device: None  Time Tolerated: 11-30 minutes  Activity Limited By: No limitations    SUBJECTIVE:  Subjective  Significant hospital events: 76 y.o. female who presents to [facility] s/p craniotomy with resection of meningioma 9/26  Mental / Cognitive Status: Alert;Cooperative;Follows Commands  Persons Present: Spouse;Family  Pain: Patient has no complaint of pain  Comments: PMH: meningioma, hypothyroidism, HTN, HLD, GERD, anxiety, Depression and arthritis   Ambulation Assist: Independent Mobility in [employer] without Device  Patient Owned Equipment: None  Home Situation: Lives with Family  Type of Home: House  Entry Stairs: 1-2 Stairs  In-Home Stairs: Able to Live on One Level  Comments: Denies falls prior to admission.    BED MOBILITY/TRANSFERS:  Bed Mobility/Transfers  Bed Mobility: Supine to Sit: Standby Assist  Bed Mobility: Sit to Supine: Standby Assist  Transfer Type: Sit to Stand  Transfer: Assistance Level: To/From;Bed;Standby Assist  Transfer: Assistive Device: None  Transfers: Type Of Assistance: For Safety Considerations  End Of Activity Status: In Bed;Nursing Notified;Instructed Patient to Request Assist with Mobility;Instructed Patient to Use Call Light (bed alarm set)    BALANCE:  Balance  Sitting Balance: Static Sitting Balance;Dynamic Sitting Balance;Standby Assist  Standing Balance: Static Standing Balance;Dynamic Standing Balance;Standby Assist    GAIT:  Gait  Gait Distance: 620 feet  Gait: Assistance Level: Minimal Assist (contact guard assist)  Gait: Assistive Device: None  Gait: Descriptors: Pace: Normal;Swing-Through Gait;No balance loss;Normal step length  Stairs: Number Climbed: 3  Stairs: Descriptors: Reciprocal  Stairs: Assistance Level: Minimal Assist (contact guard assist)  Stairs: Assistive Device: Two Rails Activity Limited By: Patient Choice (no limitations)    EDUCATION:  Education  Persons Educated: Patient  Patient Barriers To Learning: None Noted  Teaching Methods: Verbal Instruction  Patient Response: Verbalized Understanding  Topics: Plan/Goals of PT Interventions;Mobility Progression;Safety Awareness;Up with Assist Only;Importance of Increasing Activity;Ambulate With Nursing;Recommend Continued Therapy    ASSESSMENT/PROGRESS:  Impaired Mobility Due To: Decreased Strength;Impaired Balance;Safety Concerns;Decreased Activity Tolerance  Assessment/Progress: Should Improve w/ Continued PT  Comments: Patient with improved mobility this date, demonstrated by less assist needed for all mobility. Patient slightly impulsive but able to redirect with verbal cues. Patient able to find her way back to her room without cues.     AM-PAC 6 Clicks Basic Mobility Inpatient  Turning from your back to your side while in a flat bed without using bed rails: None  Moving from lying on your back to sitting on the side of a flatbed without using bedrails : None  Moving to and from a bed to a chair (including a wheelchair): A Little  Standing up from a chair using your arms (e.g. wheelchair, or bedside chair): A Little  To walk in hospital room: A Little  Climbing 3-5 steps with a railing: A Little  Raw Score: 20  Standardized (T-scale) Score: 43.99  Basic Mobility CMS 0-100%: 33.32  CMS G Code Modifier for Basic Mobility: CJ    GOALS:  Goals  Goal Formulation: With Patient  Time For Goal Achievement: 7 days  Pt Will Go Supine To/From Sit: w/ Stand By Assist, Met  Pt Will Transfer Sit to Stand: w/ Stand By Assist  Pt Will Ambulate: Greater than 200 Feet, w/ No Device, w/ Stand By Assist  Pt Will Go Up / Down Stairs: 3-5 Stairs,  w/ Minimal Assist    PLAN:  Plan   Treatment Interventions: Mobility Training;Strengthening;Balance Activities  Plan Frequency: 5 Days per Week  PT Plan for Next Visit: continue with high level balance exercise; increasing indepence with gait and mobility    RECOMMENDATIONS:  PT Discharge Recommendations: Home with Assistance and Home Health Setting  Equipment Recommendations: None    Therapist: Delia Chimes, PT  Date: 08/15/2017

## 2017-08-15 NOTE — Progress Notes
Patient arrived to room # [id number]) via ambulation accompanied by RN. Patient transferred to the bed with assistance. Bedside safety checks completed. Initial patient assessment completed, refer to flowsheet for details. Admission skin assessment completed by:     Pressure Injury Present on Hospital Admission (within 24 hours): No    1. Occiput: No  2. Ear: No  3. Scapula: No  4. Spinous Process: No  5. Shoulder: No  6. Elbow: No  7. Iliac Crest: No  8. Sacrum/Coccyx: No  9. Ischial Tuberosity: No  10. Trochanter: No  11. Knee: No  12. Malleolus: No  13. Heel: No  14. Toes: No  15. Assessed for device associated injury No  16. Nursing Nutrition Assessment Completed No    See Doc Flowsheet for additional wound details.

## 2017-08-16 LAB — MAGNESIUM: Lab: 2 mg/dL (ref >60)

## 2017-08-16 LAB — PHOSPHORUS: Lab: 3.1 mg/dL — ABNORMAL LOW (ref 2.0–4.5)

## 2017-08-16 LAB — CBC: Lab: 12 K/UL — ABNORMAL HIGH (ref 4.5–11.0)

## 2017-08-16 MED ORDER — DEXAMETHASONE 4 MG PO TAB
4 mg | ORAL | 0 refills | Status: DC
Start: 2017-08-16 — End: 2017-08-17
  Administered 2017-08-17 (×2): 4 mg via ORAL

## 2017-08-16 NOTE — Progress Notes
Neurosurgery Progress Note      Admission Date: 08/12/2017 LOS: 4 days  ______________________________________________________________    S: No acute events noted. Doing excellent this AM, up walking around this AM.  Feels ready to leave hospital.      O:                  Vital Signs: 24 Hour Range   BP: (135-149)/(60-80)   Temp:  [36.4 C (97.5 F)-37.1 C (98.8 F)]   Pulse:  [74-95]   Respirations:  [16 PER MINUTE-18 PER MINUTE]   SpO2:  [95 %-98 %]   O2 Delivery: None (Room Air)     Awake, alert, oriented  Follows commands x 4 extremities, good strength  Incision CDI without redness or drainage, staples       A/P: 76 y.o. female   Active Problems:    Meningioma ([facility])    OR 08/12/17 Bifrontal craniotomy for resection of recurrent falcine meningioma    Doing excellent this AM  Dex, Keppra, subQ  PT/OT following, recommend inpatient rehab, Rehab consult placed. Swing bed DC plan.      Prophylaxis:     B) Lines:  No  C) Urinary Catheter:  No  D) Antibiotic Usage:  No  E) VTE:  Pharmacological prophylaxis; SQ Heparin and Mechanical prophylaxis; Sequential compression device  F) Restraints: Patient assessed for need for restraints.     Please call [phone] with any questions.    Bernerd Limbo, MD  Pager [phone]

## 2017-08-17 ENCOUNTER — Encounter: Admit: 2017-08-17 | Discharge: 2017-08-17 | Payer: MEDICARE

## 2017-08-17 ENCOUNTER — Inpatient Hospital Stay: Admit: 2017-08-12 | Discharge: 2017-08-12 | Payer: MEDICARE

## 2017-08-17 ENCOUNTER — Ambulatory Visit: Admit: 2017-08-17 | Discharge: 2017-08-31 | Payer: MEDICARE

## 2017-08-17 DIAGNOSIS — E872 Acidosis: ICD-10-CM

## 2017-08-17 DIAGNOSIS — E785 Hyperlipidemia, unspecified: ICD-10-CM

## 2017-08-17 DIAGNOSIS — J9602 Acute respiratory failure with hypercapnia: ICD-10-CM

## 2017-08-17 DIAGNOSIS — I10 Essential (primary) hypertension: ICD-10-CM

## 2017-08-17 DIAGNOSIS — D329 Benign neoplasm of meninges, unspecified: Principal | ICD-10-CM

## 2017-08-17 DIAGNOSIS — K219 Gastro-esophageal reflux disease without esophagitis: ICD-10-CM

## 2017-08-17 DIAGNOSIS — E039 Hypothyroidism, unspecified: ICD-10-CM

## 2017-08-17 DIAGNOSIS — Z86011 Personal history of benign neoplasm of the brain: ICD-10-CM

## 2017-08-17 DIAGNOSIS — G9389 Other specified disorders of brain: ICD-10-CM

## 2017-08-17 DIAGNOSIS — Z9851 Tubal ligation status: ICD-10-CM

## 2017-08-17 DIAGNOSIS — Z87891 Personal history of nicotine dependence: ICD-10-CM

## 2017-08-17 DIAGNOSIS — D72829 Elevated white blood cell count, unspecified: ICD-10-CM

## 2017-08-17 DIAGNOSIS — T380X5A Adverse effect of glucocorticoids and synthetic analogues, initial encounter: ICD-10-CM

## 2017-08-17 DIAGNOSIS — R112 Nausea with vomiting, unspecified: ICD-10-CM

## 2017-08-17 DIAGNOSIS — M1990 Unspecified osteoarthritis, unspecified site: ICD-10-CM

## 2017-08-17 DIAGNOSIS — K5790 Diverticulosis of intestine, part unspecified, without perforation or abscess without bleeding: ICD-10-CM

## 2017-08-17 DIAGNOSIS — R569 Unspecified convulsions: ICD-10-CM

## 2017-08-17 DIAGNOSIS — C801 Malignant (primary) neoplasm, unspecified: Principal | ICD-10-CM

## 2017-08-17 DIAGNOSIS — R011 Cardiac murmur, unspecified: ICD-10-CM

## 2017-08-17 DIAGNOSIS — F419 Anxiety disorder, unspecified: ICD-10-CM

## 2017-08-17 DIAGNOSIS — F329 Major depressive disorder, single episode, unspecified: ICD-10-CM

## 2017-08-17 LAB — PHOSPHORUS: Lab: 3.2 mg/dL — ABNORMAL LOW (ref 2.0–4.5)

## 2017-08-17 LAB — MAGNESIUM: Lab: 2.1 mg/dL — ABNORMAL HIGH (ref >60)

## 2017-08-17 LAB — CBC: Lab: 12 10*3/uL — ABNORMAL HIGH (ref 4.5–11.0)

## 2017-08-17 MED ORDER — SENNOSIDES-DOCUSATE SODIUM 8.6-50 MG PO TAB
1 | Freq: Two times a day (BID) | ORAL | 0 refills | Status: AC
Start: 2017-08-17 — End: ?

## 2017-08-17 MED ORDER — LEVETIRACETAM 500 MG PO TAB
500 mg | ORAL_TABLET | Freq: Two times a day (BID) | ORAL | 0 refills | 90.00000 days | Status: AC
Start: 2017-08-17 — End: ?

## 2017-08-17 MED ORDER — ACETAMINOPHEN 325 MG PO TAB
650 mg | ORAL | 0 refills | 8.00000 days | Status: AC | PRN
Start: 2017-08-17 — End: ?

## 2017-08-17 MED ORDER — DEXAMETHASONE 2 MG PO TAB
ORAL_TABLET | Freq: Three times a day (TID) | INTRAMUSCULAR | 0 refills | 14.00000 days | Status: AC
Start: 2017-08-17 — End: 2017-08-31

## 2017-08-17 NOTE — Discharge Planning (AHS/AVS)
[facility] [phone]) is to start home health services within 24-48 hours of hospital discharge. If you do not hear from them within 24 hours of hospital discharge, please call them at the above telephone number.    Thank you, and take good care of yourself!    Clyda Hurdle, RN CM

## 2017-08-17 NOTE — Discharge Instructions - Appointments
A follow up appointment has been requested. The clinic will call you with appointment details. If you have questions you may call the clinic at [phone].

## 2017-08-17 NOTE — Progress Notes
Discharge instructions printed and provided to pt. Discharge instructions reviewed with pt including activity, diet, incision care, s/s to report, s/s of infection, medications, and pain management. Pt verbalizes understanding. Hospital contact information provided to pt.

## 2017-08-17 NOTE — Progress Notes
Neurosurgery Progress Note      Admission Date: 08/12/2017 LOS: 5 days  ______________________________________________________________    S: No acute events noted. Doing well this morning and would like to discharge home with Home health today.      O:                  Vital Signs: 24 Hour Range   BP: (120-151)/(58-81)   Temp:  [36.6 C (97.8 F)-37.1 C (98.8 F)]   Pulse:  [72-90]   Respirations:  [16 PER MINUTE-18 PER MINUTE]   SpO2:  [95 %-98 %]   O2 Delivery: None (Room Air)     Awake, alert, oriented  Follows commands x 4 extremities, good strength  Incision CDI without redness or drainage, staples       A/P: 76 y.o. female   Active Problems:    Meningioma ([facility])    OR 08/12/17 Bifrontal craniotomy for resection of recurrent falcine meningioma    Doing excellent this AM  Dex, Keppra, subQ  Regular diet  BM 9/29  PT/OT following, previously recommending inpatient now recommending home with [facility]  Will plan for discharge home later today with HH      Prophylaxis:     B) Lines:  No  C) Urinary Catheter:  No  D) Antibiotic Usage:  No  E) VTE:  Pharmacological prophylaxis; SQ Heparin and Mechanical prophylaxis; Sequential compression device  F) Restraints: Patient assessed for need for restraints.     Please call [phone] with any questions.    Melford Aase, APRN-NP  Pager [phone]

## 2017-08-19 NOTE — Discharge Instructions - Pharmacy
Physician Discharge Summary      Name: Angela Arias  Medical Record Number: [id number]        Account Number:  [id number]  Date Of Birth:  Feb 13, 1941                         Age:  76 years   Admit date:  08/12/2017                     Discharge date:  08/17/17    Attending Physician:  Racheal Patches, MD               Service: [facility]    Physician Summary completed by: Troy Sine, APRN-NP    Reason for hospitalization: Meningioma    Significant PMH:   Past Medical History:   Diagnosis Date   ??? Anxiety disorder    ??? Arthritis    ??? Cancer (HCC)     pancreas and stomach   ??? Depression    ??? Diverticulosis    ??? GERD (gastroesophageal reflux disease)    ??? Heart murmur    ??? HLD (hyperlipidemia)    ??? Hypertension    ??? Hypothyroidism    ??? Meningioma (HCC) 2012   ??? PONV (postoperative nausea and vomiting)    ??? Seizure (HCC)     times 1 after first craniotomy          Allergies: Patient has no known allergies.    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  She was diagnosed with a left frontal parasagittal meningioma in 2013. She had surgery at that time and was told that a small residual tumor was left on the superior sagittal sinus. Pathology showed an atypical meningioma. Patient did not have radiation therapy after that.  She developed an episode of seizure postop. She was then followed with serial imaging. In 2015, she was diagnosed with recurrence/progression. The mass continued to progress over time. At this point, her current MRI shows the presence of a 4 cm tumor.  Clinically she is doing well. No weakness, or numbness. No significant headache. No speech difficulty.      Admission Lab/Radiology studies notable for: MRI shows the presence of a 4 cm tumor.    Brief Hospital Course:  The patient was admitted and the following issues were addressed during this hospitalization: (with pertinent details). Patient was admitted and underwent the procedure listed below. Patient was extubated and tolerated procedure well. Patient was admitted to Neurology ICU in stable condition. Upon arrival to the ICU she was slow to wake from anesthesia. ABG was obtained and PCO2 was elevated. Patient was ultimately re-intubated and extubated the next day. Post-op MRI was obtained.  Post-operatively, patient's diet was advanced and was mobilized with PT/OT. Pain was controlled. Patient was transferred to floor and dressings removed. Patient continued to work with PT/OT. Patient was originally planning to discharge to swing bed for rehab on 10/1 but mobility improved over the weekend to the point that she was able to go home with home health. Patient was discharged home with home health with follow-up appointment with Dr. Francis Gaines.        Condition at Discharge: Stable    Discharge Diagnoses:       Hospital Problems        Active Problems    Meningioma [facility])  Surgical Procedures:    1. Bifrontal craniotomy for resection of recurrent falcine meningioma  2. Use of neuronavigation  3. Cranioplasty < 5cm  Significant Diagnostic Studies and Procedures: noted in brief hospital course    Consults:  Rehabilitative Medicine and Neuro Critical Care    Patient Disposition: Home with Home Health Care       Patient instructions/medications:     Activity as Tolerated   It is important to keep increasing your activity level after you leave the hospital.  Moving around can help prevent blood clots, lung infection (pneumonia) and other problems.  Gradually increasing the number of times you are up moving around will help you return to your normal activity level more quickly.  Continue to increase the number of times you are up to the chair and walking daily to return to your normal activity level. Begin to work toward your normal activity level at discharge. As tolerated; No driving until cleared by your surgeon at follow up appointment. Avoid pulling, pushing or lifting greater than 10 pounds.     Report These Signs and Symptoms   Please contact your doctor if you have any of the following symptoms: Call if temperature greater than 101, incision red, drainage or odor noted from incision, pain that is uncontrolled with pain medication, numbness or weakness, vision changes, trouble with speech or slurring of speech, or any questions/concerns.     Questions About Your Stay   For questions or concerns regarding your hospital stay. Call [phone]. If you have any questions once at home during the work week between the hours of 0800 -1500, please call the clinic at [phone].  Otherwise, please call the main hospital number [phone]) and ask for the neurosurgery resident on call to be paged.   Discharging attending physician: Racheal Patches [[id number]]      Regular Diet   You have no dietary restriction. Please continue with a healthy balanced diet.     Incision Care   *Keep your incision clean and dry.  *May shower. Avoid direct water contact to the incision.  *Do not submerge incision in tub, pool, hot tub, or lake for 4 weeks.  *Your incision should gradually look better each day. If you notice unusual swelling, redness, drainage, have increasing pain at the site, or have a fever greater than 100 degrees, notify your physician immediately.        Current Discharge Medication List       START taking these medications    Details   acetaminophen (TYLENOL) 325 mg tablet Take two tablets by mouth every 4 hours as needed.  Refills: 0    PRESCRIPTION TYPE:  OTC      dexamethasone (DECADRON) 2 mg tablet Taper schedule: 4 mg every 8 hours for 2 days, then 4 mg every 12 hours for 2 days, then 2 mg every 12 hours for 2 days, then 2 mg every day for 2 days, then stop  Take with food.  Qty: 26 tablet, Refills: 0    PRESCRIPTION TYPE:  Print      levETIRAcetam (KEPPRA) 500 mg tablet Take one tablet by mouth twice daily for 9 days. Qty: 18 tablet, Refills: 0    PRESCRIPTION TYPE:  Print      senna/docusate (SENOKOT-S) 8.6/50 mg tablet Take one tablet by mouth twice daily.  Refills: 0    PRESCRIPTION TYPE:  OTC          CONTINUE these medications which have NOT CHANGED  Details   amLODIPine (NORVASC) 5 mg tablet Take 5 mg by mouth daily.    PRESCRIPTION TYPE:  Historical Med      ascorbic acid(+) (VITAMIN C) 1,000 mg tablet Take 1 tablet by mouth daily.    PRESCRIPTION TYPE:  Historical Med      aspirin 81 mg chewable tablet Chew 81 mg by mouth daily. Take with food.    PRESCRIPTION TYPE:  Historical Med      Calcium-Vitamin D3-Vitamin K (VIACTIV) 500-500-40 mg-unit-mcg chew Chew 1 tablet by mouth daily.    PRESCRIPTION TYPE:  Historical Med      famotidine(+) (PEPCID) 40 mg tablet Take 40 mg by mouth daily before breakfast.    PRESCRIPTION TYPE:  Historical Med      Fish Oil-Omega-3 Fatty Acids (FISH OIL) 360-1,200 mg cap Take 1 capsule by mouth daily.    PRESCRIPTION TYPE:  Historical Med      levothyroxine (SYNTHROID) 75 mcg tablet Take 75 mcg by mouth daily 30 minutes before breakfast.    PRESCRIPTION TYPE:  Historical Med      MULTIVITAMIN PO Take 1 tablet by mouth daily.    PRESCRIPTION TYPE:  Historical Med      PARoxetine (PAXIL) 20 mg tablet Take 20 mg by mouth at bedtime daily.    PRESCRIPTION TYPE:  Historical Med      polyvinyl alcohol/povidone(+) (REFRESH) 1.4/0.6 % ophthalmic solution Apply 1 drop to both eyes as Needed.    PRESCRIPTION TYPE:  Historical Med      simvastatin (ZOCOR) 20 mg tablet Take 1 tablet (20mg ) by mouth on Monday, Wednesday, and Friday at bedtime.    PRESCRIPTION TYPE:  Historical Med      vitamin E 400 unit capsule Take 400 Units by mouth daily.    PRESCRIPTION TYPE:  Historical Med              Scheduled appointments:    Aug 31, 2017 10:30 AM CDT  Post - Op with NEUROSURGERY NP CLINIC  [facility] [address]  [address] [place] [address]  [phone]   Sep 14, 2017 11:45 AM CDT  Post - Op with Racheal Patches, MD  [facility]) [facility] [address]  [address]  [place] [address]  [phone]    Additional appointment instructions:      A follow up appointment has been requested. The clinic will call you with appointment details. If you have questions you may call the clinic at [phone].                    Pending items needing follow up: none    Signed:  Troy Sine, APRN-NP  08/19/2017      cc:  Primary Care Physician:  Lona Kettle   Verified  Referring physicians:  Lona Kettle, MD   Additional provider(s):

## 2017-08-31 ENCOUNTER — Encounter: Admit: 2017-08-31 | Discharge: 2017-08-31 | Payer: MEDICARE

## 2017-08-31 ENCOUNTER — Ambulatory Visit: Admit: 2017-08-31 | Discharge: 2017-08-31 | Payer: MEDICARE

## 2017-08-31 ENCOUNTER — Ambulatory Visit: Admit: 2017-08-31 | Discharge: 2017-09-01 | Payer: MEDICARE

## 2017-08-31 DIAGNOSIS — E785 Hyperlipidemia, unspecified: ICD-10-CM

## 2017-08-31 DIAGNOSIS — C801 Malignant (primary) neoplasm, unspecified: Principal | ICD-10-CM

## 2017-08-31 DIAGNOSIS — Z4802 Encounter for removal of sutures: ICD-10-CM

## 2017-08-31 DIAGNOSIS — R112 Nausea with vomiting, unspecified: ICD-10-CM

## 2017-08-31 DIAGNOSIS — D329 Benign neoplasm of meninges, unspecified: Principal | ICD-10-CM

## 2017-08-31 DIAGNOSIS — K5790 Diverticulosis of intestine, part unspecified, without perforation or abscess without bleeding: ICD-10-CM

## 2017-08-31 DIAGNOSIS — K219 Gastro-esophageal reflux disease without esophagitis: ICD-10-CM

## 2017-08-31 DIAGNOSIS — M1990 Unspecified osteoarthritis, unspecified site: ICD-10-CM

## 2017-08-31 DIAGNOSIS — I10 Essential (primary) hypertension: ICD-10-CM

## 2017-08-31 DIAGNOSIS — R569 Unspecified convulsions: ICD-10-CM

## 2017-08-31 DIAGNOSIS — E039 Hypothyroidism, unspecified: ICD-10-CM

## 2017-08-31 DIAGNOSIS — F419 Anxiety disorder, unspecified: ICD-10-CM

## 2017-08-31 DIAGNOSIS — R011 Cardiac murmur, unspecified: ICD-10-CM

## 2017-08-31 DIAGNOSIS — F329 Major depressive disorder, single episode, unspecified: ICD-10-CM

## 2017-08-31 NOTE — Progress Notes
Radiation Oncology Consultation    Date:  09/01/2017    Angela Arias is a 76 y.o. female.     Requesting Provider: Jeannetta Ellis, APRN-NP     The encounter diagnosis was Meningioma [facility]).  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 She was originally diagnosed in 2012 and underwent resection of the meningioma at that time by Dr. Vonita Moss in [place], [place]. Pathology revealed a WHO grade II meningioma with 4 mitoses per HPF. There was a small residual tumor, and Dr. Vonita Moss recommended observation. Her post-operative course was complicated by a seizure (single episode). She did not receive any radiation therapy and was followed with MRIs, which demonstrated slow growth of tumor. MRI on 11/04/16 revealed the recurrent tumor to measure 3.2 x 3.3 x 3 cm. The next MRI on 06/04/17 demonstrated growth to 4.2 x 3.5 x 3.6 cm (see representative image below). She was referred by Dr. Vonita Moss to Dr. Francis Gaines at [facility], whom she saw on 8/27 and recommended resection. She had a gross total resection on 9/26, and pathology revealed a meningioma with 3-4 mitoses per HPF meeting criteria for a grade II atypical meningioma. She has done well post-operatively and has no complaints today. She is referred to [place] by Dr. Francis Gaines to discuss radiation therapy.    Her history is also notable for carcinoma of the ampulla of Vater, which was treated with surgery (Whipple procedure) in 2010. She has been followed closely with serial CT scans with no evidence of recurrence since.    Radiation History: None         Past Medical History:  Patient Active Problem List    Diagnosis Date Noted   ??? Meningioma (HCC) 07/13/2017     Past Medical History:   Diagnosis Date   ??? Anxiety disorder    ??? Arthritis    ??? Cancer (HCC)     pancreas and stomach   ??? Depression    ??? Diverticulosis ??? GERD (gastroesophageal reflux disease)    ??? Heart murmur    ??? HLD (hyperlipidemia)    ??? Hypertension    ??? Hypothyroidism    ??? Meningioma (HCC) 2012   ??? PONV (postoperative nausea and vomiting)    ??? Seizure (HCC)     times 1 after first craniotomy     Past Surgical History:   Procedure Laterality Date   ??? WHIPPLE  2010   ??? CRANIOTOMY  03/14/2011   ??? CRANIOTOMY Left 08/12/2017    BIFRONTAL CRANIOTOMY, RESECTION OF MENINGIOMA performed by Racheal Patches, MD at [facility] OR/Periop   ??? BREAST SURGERY Left     benign   ??? HX TONSILLECTOMY     ??? HX TUBAL LIGATION     ??? ROTATOR CUFF REPAIR Right        Medications  ??? acetaminophen (TYLENOL) 325 mg tablet Take two tablets by mouth every 4 hours as needed.   ??? amLODIPine (NORVASC) 5 mg tablet Take 5 mg by mouth daily.   ??? ascorbic acid(+) (VITAMIN C) 1,000 mg tablet Take 1 tablet by mouth daily.   ??? aspirin 81 mg chewable tablet Chew 81 mg by mouth daily. Take with food.   ??? Calcium-Vitamin D3-Vitamin K (VIACTIV) 500-500-40 mg-unit-mcg chew Chew 1 tablet by mouth daily.   ??? famotidine(+) (PEPCID)  40 mg tablet Take 40 mg by mouth daily before breakfast.   ??? Fish Oil-Omega-3 Fatty Acids (FISH OIL) 360-1,200 mg cap Take 1 capsule by mouth daily.   ??? levothyroxine (SYNTHROID) 75 mcg tablet Take 75 mcg by mouth daily 30 minutes before breakfast.   ??? MULTIVITAMIN PO Take 1 tablet by mouth daily.   ??? PARoxetine (PAXIL) 20 mg tablet Take 20 mg by mouth at bedtime daily.   ??? polyvinyl alcohol/povidone(+) (REFRESH) 1.4/0.6 % ophthalmic solution Apply 1 drop to both eyes as Needed.   ??? senna/docusate (SENOKOT-S) 8.6/50 mg tablet Take one tablet by mouth twice daily.   ??? simvastatin (ZOCOR) 20 mg tablet Take 1 tablet (20mg ) by mouth on Monday, Wednesday, and Friday at bedtime.   ??? vitamin E 400 unit capsule Take 400 Units by mouth daily.       Allergies:   No Known Allergies    Social History:  Social History     Social History   ??? Marital status: Married     Spouse name: N/A ??? Number of children: N/A   ??? Years of education: N/A     Social History Main Topics   ??? Smoking status: Former Smoker     Packs/day: 0.75     Years: 20.00     Types: Cigarettes     Quit date: 1982   ??? Smokeless tobacco: Never Used   ??? Alcohol use No   ??? Drug use: No   ??? Sexual activity: Not on file     Other Topics Concern   ??? Not on file     Social History Narrative   ??? No narrative on file        Family History:  Family History   Problem Relation Age of Onset   ??? Cancer Mother         colon   ??? Cancer Brother         colon   ??? Cirrhosis Brother        Review of Systems   HENT: Positive for dental problem, hearing loss and tinnitus.    Genitourinary: Positive for dysuria, frequency and urgency.   Psychiatric/Behavioral: The patient is nervous/anxious.    All other systems reviewed and are negative.       Patient Evaluated for a Clinical Trial: No treatment clinical trial available for this patient.    KARNOFSKY PERFORMANCE SCORE:  90% Able to carry on normal activity; minor signs of disease   Physical Exam  General: Pleasant older white female in no acute distress. Alert and oriented. Appears well.  Head: Left frontal craniotomy scar healing well.   Eyes: Normal sclerae/conjunctivae. Extraocular movements intact.  Nose: Nares within normal limits  Mouth: Moist mucous membranes  Lungs: No increased work of breathing  Skin: No rashes or pallor   Neurological: Cranial nerves 2-12 intact, no gross focal deficits. Strength 5/5 throughout.  Musculoskeletal: No deformity. Gait is normal.  Psychiatric: Normal mood and affect      LABORATORY:     Comprehensive Metabolic Profile    Lab Results   Component Value Date/Time    NA 142 08/15/2017 05:55 AM    K 3.3 (L) 08/15/2017 05:55 AM    CL 106 08/15/2017 05:55 AM    CO2 26 08/15/2017 05:55 AM    GAP 10 08/15/2017 05:55 AM    BUN 11 08/15/2017 05:55 AM    CR 0.42 08/15/2017 05:55 AM    GLU 130 (H) 08/15/2017 05:55 AM  Lab Results   Component Value Date/Time CA 8.8 08/15/2017 05:55 AM    PO4 3.2 08/17/2017 03:48 AM    GFR >60 08/15/2017 05:55 AM    GFRAA >60 08/15/2017 05:55 AM        CBC w/Diff    Lab Results   Component Value Date/Time    WBC 12.5 (H) 08/17/2017 03:48 AM    RBC 4.02 08/17/2017 03:48 AM    HGB 12.6 08/17/2017 03:48 AM    HCT 37.0 08/17/2017 03:48 AM    MCV 91.9 08/17/2017 03:48 AM    MCH 31.3 08/17/2017 03:48 AM    MCHC 34.0 08/17/2017 03:48 AM    RDW 13.2 08/17/2017 03:48 AM    PLTCT 257 08/17/2017 03:48 AM    MPV 10.0 08/17/2017 03:48 AM    No results found for: NEUT, ANC, LYMA, ALC, MONA, AMC, EOSA, AEC, BASA, ABC       IMAGING:    08/13/17: MRI Head  IMPRESSION  1. Bifrontal craniotomy and left anterior perifalcine meningioma resection   without evidence of residual tumor.  2. Mild to moderate postprocedural pneumocephalus and residual left   frontal vasogenic edema associated localized mass effect and mild   left-to-right frontal midline shift.  3. Mild nonspecific supratentorial white matter disease and pontine FLAIR   hyperintensity, likely due to chronic microvascular ischemia.        06/04/17: Outside MRI      PATHOLOGY:    08/21/17:  Final Diagnosis:     A. Brain, brain tumor, bifrontal craniotomy:   Atypical Meningioma, WHO GRADE II, Recurrent   See comment. ???     B. Bone, cranial bone flap, bifrontal craniotomy:   BONE AND ADJACENT MENINGIOMA   BONE FOCALLY INVOLVED BY MeningiomA     Comment:   Sections show an atypical meningioma with dense fibrosis and a mitotic   count that ranges from 3-4 per 10 high-power fields, fulfilling the   criteria for a WHO Grade II meningioma.     BRAIN/Resection   History of Previous Tumor/Familial Syndrome   Atypical meningioma, WHO Grade II, 2013 (information accessed in O2)   Specimen Type/Procedure   Resection   Specimen Handling   Routine permanent paraffin sections   Specimen Size   Greatest dimension: 6.0 cm   Laterality   Bifrontal   Tumor Site   Dura/Leptomeninges   Histologic Type Tumors of the Meninges/Meningothelial Cells   Meningioma   WHO Histologic Grade   WHO grade II ??? ??? ??? ??? ??? ??? ???   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 She had regrowth of tumor to 4.2 cm by July of this year and underwent resection at [facility] on 9/27. Her history is also notable for a carcinoma of the ampulla of Vater status post Whipple procedure in 2010 and followed closely with serial CT scans with no evidence of recurrence.    RECOMMENDATIONS: We discussed that optimal management of grade II meningiomas is an area of controversy. However, it is known that they have a high recurrence rate and given that she has already failed observation, we would recommend radiation treatment. Radiation following gross total resection has been shown retrospectively to  result in improved local control. Haywood Pao et al conducted a meta-analysis of 757 patients with atypical meningioma and found that the addition of radiation (with a mean dose of 54 Gy) following gross total resection halved the 5 year recurrence rate from 33.7% to 15%. 5 year local control improved from 62% to 73% with radiation Haywood Pao et al, World Neurosurg, 2015).    In RTOG 0539, a prospective phase II clinical trial of patients with either newly diagnosed grade II meningioma status post GTR or recurrent grade I meningioma status post any resection. Patients were allocated into 1 of 3 prognostic groups based on WHO grade, recurrence status, and resection extent. Patients on this trial received 54 Gy in 30 fractions with margins between 0.8 and 1.5 cm. Primary endpoint was 3 year PFS. There was no significant difference in outcomes between grade II tumors and recurrent grade I tumors, and the 3-year PFS was 93.8%. Based on the very good control rates on this trial, the routine use of post-operative radiation in recurrent grade I or newly diagnosed grade II tumors is supported.    As she has a recurrent grade II tumor, we recommend treatment with 54 Gy in 30 fractions to the surgical cavity with at least a 0.8 cm margin.     The risks and benefits of radiation therapy were discussed in detail with the patient including but not limited to fatigue, hair loss, headache, nausea, radiation necrosis, brain/CNS damage, and secondary malignancies. She stated she wanted to proceed, but she would like to receive radiation close to home in [place], [place]. We will make a referral to the radiation oncology facility there.    Deirdre Evener, MD  PGY-5 Radiation Oncology         CC: A copy of this note has been sent to the referring provider, Dr. Adriana Simas, Nettie Elm, APRN-NP.    ATTESTATION    I personally performed the key portions of the E/M visit, discussed case with resident and concur with resident documentation of history, physical exam, assessment, and treatment plan unless otherwise noted.    Staff name:  Jason Nest, MD Date:  09/02/2017

## 2017-08-31 NOTE — Progress Notes
Angela Arias returns today for suture removal 2wk f/u s/p  BIFRONTAL CRANIOTOMY, RESECTION OF MENINGIOMA on 08/12/17 with Dr Enrique Sack.  Pathology Consistent with:  Atypical Meningioma   Overall, she is doing well.  She does report a decreased appetite, but is taking fluids well.  She denies constipation.  She does report some moderate fatigue.   She completed her dexamethasone taper and post operative Keppra.   She denies any drainage/swelling/redness of her incision.     Physical Exam    Alert/Fully Oriented.   Neuro Exam Stable and Intact  Incision edges well approximated.  No S/S of infection noted.   Gait Steady    Angela Arias returns today for staple removal.  Staple removed without difficulty.  Wound care discussed.  Overall, she is doing well.  We discussed the need to pace herself  and rest when she feels tired.  We discussed her pathology.  I will refer her for radiation with Dr. Mina Marble.  She will return for surgical follow up with Dr. Enrique Sack on 09/14/2017.

## 2017-09-14 ENCOUNTER — Encounter: Admit: 2017-09-14 | Discharge: 2017-09-14 | Payer: MEDICARE

## 2017-09-14 ENCOUNTER — Ambulatory Visit: Admit: 2017-09-14 | Discharge: 2017-09-15 | Payer: MEDICARE

## 2017-09-14 DIAGNOSIS — I10 Essential (primary) hypertension: ICD-10-CM

## 2017-09-14 DIAGNOSIS — F419 Anxiety disorder, unspecified: ICD-10-CM

## 2017-09-14 DIAGNOSIS — K5790 Diverticulosis of intestine, part unspecified, without perforation or abscess without bleeding: ICD-10-CM

## 2017-09-14 DIAGNOSIS — R011 Cardiac murmur, unspecified: ICD-10-CM

## 2017-09-14 DIAGNOSIS — D329 Benign neoplasm of meninges, unspecified: Principal | ICD-10-CM

## 2017-09-14 DIAGNOSIS — M1990 Unspecified osteoarthritis, unspecified site: ICD-10-CM

## 2017-09-14 DIAGNOSIS — C801 Malignant (primary) neoplasm, unspecified: Principal | ICD-10-CM

## 2017-09-14 DIAGNOSIS — R569 Unspecified convulsions: ICD-10-CM

## 2017-09-14 DIAGNOSIS — E785 Hyperlipidemia, unspecified: ICD-10-CM

## 2017-09-14 DIAGNOSIS — F329 Major depressive disorder, single episode, unspecified: ICD-10-CM

## 2017-09-14 DIAGNOSIS — K219 Gastro-esophageal reflux disease without esophagitis: ICD-10-CM

## 2017-09-14 DIAGNOSIS — E039 Hypothyroidism, unspecified: ICD-10-CM

## 2017-09-14 DIAGNOSIS — R112 Nausea with vomiting, unspecified: ICD-10-CM

## 2017-09-14 NOTE — Progress Notes
Angela Arias returns to the clinic for a follow-up visit.    She was diagnosed with a left frontal parasagittal meningioma in 2013.   She had surgery at that time and was told that a small residual tumor was left on the superior sagittal sinus.  Pathology showed an atypical meningioma. Patient did not have radiation therapy after that.  She was then diagnosed with progression  She underwent a bifrontal crani for resection of recurrent atypical mengingioma on 08/12/17.      She denies any complaints.  Her family feels her memory is improving.  No focal deficit on exam.  Incision well healed  She will be undergoing 30 cycles of radiation in [place].    The plan st this point will be to watch her with serial imaging after radiation.  They wish to continue to follow with her radiation oncologist in [place].  She will follow-up with [place] as needed

## 2018-02-18 ENCOUNTER — Encounter: Admit: 2018-02-18 | Discharge: 2018-02-18 | Payer: MEDICARE

## 2018-02-18 DIAGNOSIS — C801 Malignant (primary) neoplasm, unspecified: Principal | ICD-10-CM

## 2018-02-18 DIAGNOSIS — I10 Essential (primary) hypertension: ICD-10-CM

## 2018-02-18 DIAGNOSIS — F329 Major depressive disorder, single episode, unspecified: ICD-10-CM

## 2018-02-18 DIAGNOSIS — K5790 Diverticulosis of intestine, part unspecified, without perforation or abscess without bleeding: ICD-10-CM

## 2018-02-18 DIAGNOSIS — E785 Hyperlipidemia, unspecified: ICD-10-CM

## 2018-02-18 DIAGNOSIS — R569 Unspecified convulsions: ICD-10-CM

## 2018-02-18 DIAGNOSIS — F419 Anxiety disorder, unspecified: ICD-10-CM

## 2018-02-18 DIAGNOSIS — R011 Cardiac murmur, unspecified: ICD-10-CM

## 2018-02-18 DIAGNOSIS — K219 Gastro-esophageal reflux disease without esophagitis: ICD-10-CM

## 2018-02-18 DIAGNOSIS — M1990 Unspecified osteoarthritis, unspecified site: ICD-10-CM

## 2018-02-18 DIAGNOSIS — D329 Benign neoplasm of meninges, unspecified: ICD-10-CM

## 2018-02-18 DIAGNOSIS — R112 Nausea with vomiting, unspecified: ICD-10-CM

## 2018-02-18 DIAGNOSIS — E039 Hypothyroidism, unspecified: ICD-10-CM

## 2021-02-25 IMAGING — MR Head^Brain
9 series · 48 of 48 positions shown · non-contrast
Comparison: none

[Series 2: T1 · sagittal · 5.0mm · 0.45mm/px · 3 of 9 slices shown (1 of 2)]
[im 1/9]
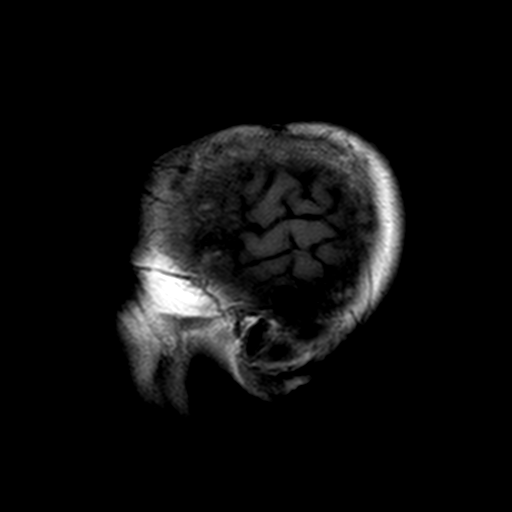
[im 5/9]
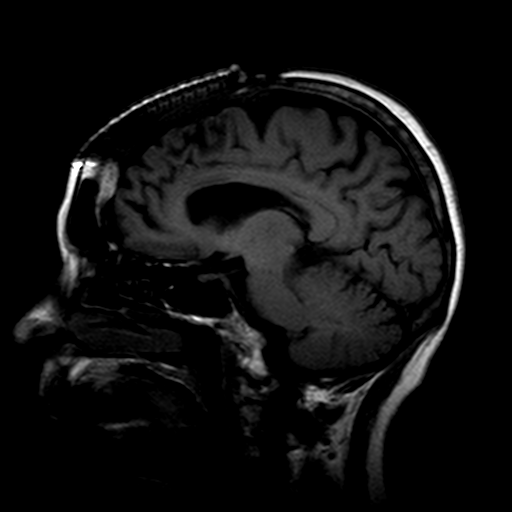
[im 9/9]
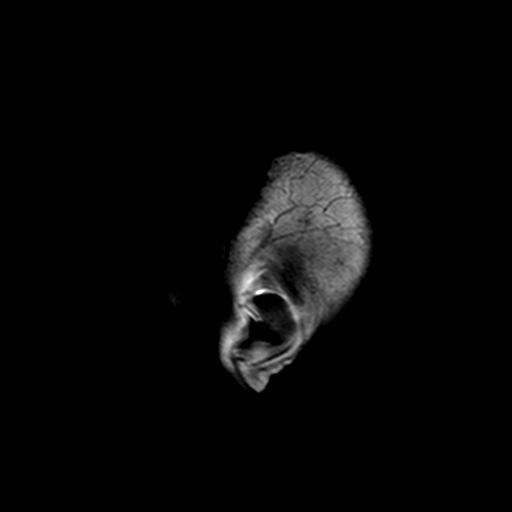

[Series 3: DWI · axial · 5.0mm · 1.80mm/px · z∈[-80,+68]mm · 14 of 53 slices shown (1 of 2)]
[im 1/53]
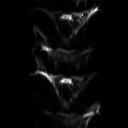
[im 5/53]
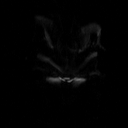
[im 9/53]
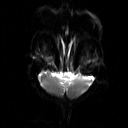
[im 13/53]
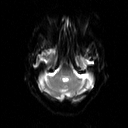
[im 17/53]
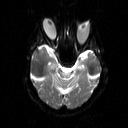
[im 21/53]
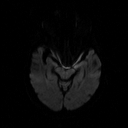
[im 25/53]
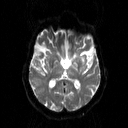
[im 29/53]
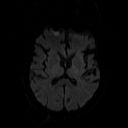
[im 33/53]
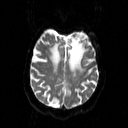
[im 37/53]
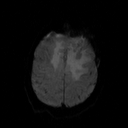
[im 41/53]
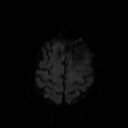
[im 45/53]
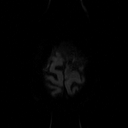
[im 49/53]
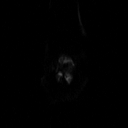
[im 53/53]
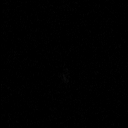

[Series 4: DWI · axial · 5.0mm · 1.80mm/px · z∈[-80,+68]mm · 4 of 15 slices shown (2 of 2)]
[im 1/15]
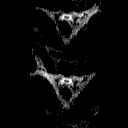
[im 5/15]
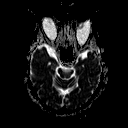
[im 10/15]
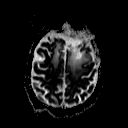
[im 15/15]
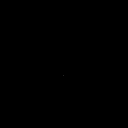

[Series 5: FLAIR · axial · 5.0mm · 0.45mm/px · 1 of 1 slices shown]
[im 1/1]
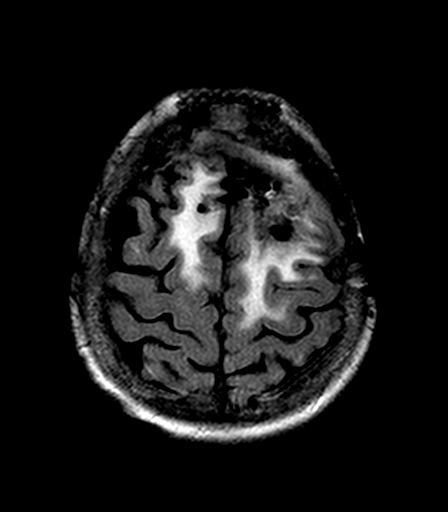

[Series 6: T2 · axial · 5.0mm · 0.72mm/px · z∈[-72,+70]mm · 6 of 21 slices shown]
[im 1/21]
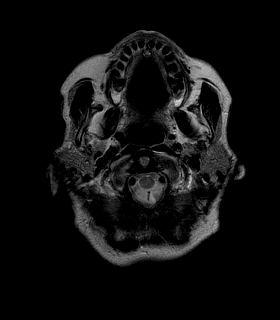
[im 5/21]
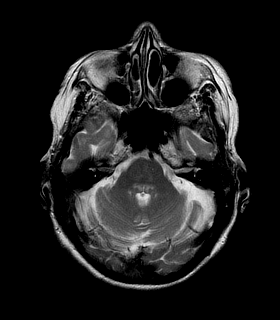
[im 9/21]
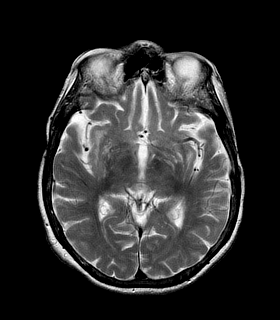
[im 13/21]
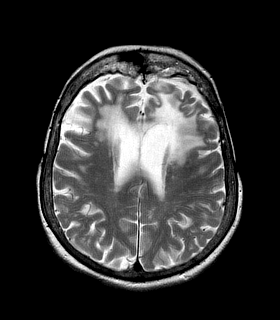
[im 17/21]
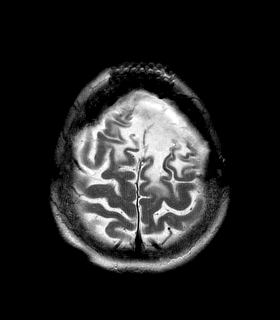
[im 21/21]
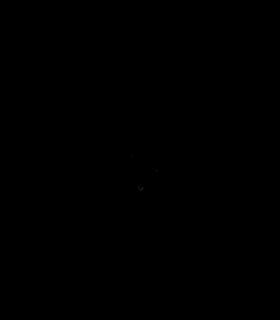

[Series 7: T1 · axial · 5.0mm · 0.45mm/px · z∈[-79,+70]mm · 5 of 20 slices shown (2 of 2)]
[im 1/20]
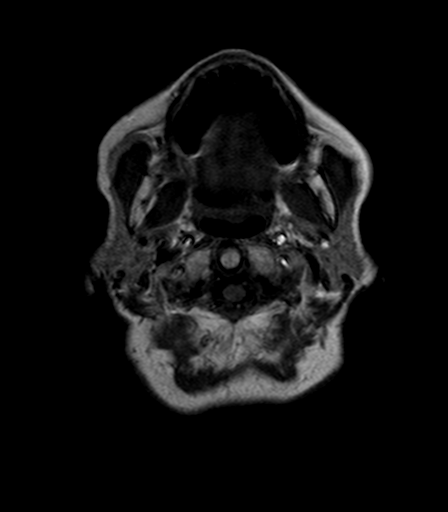
[im 5/20]
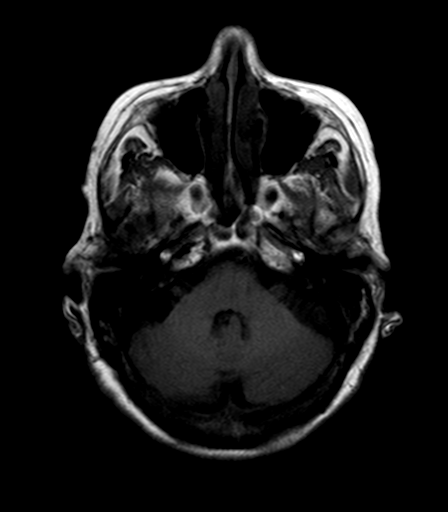
[im 10/20]
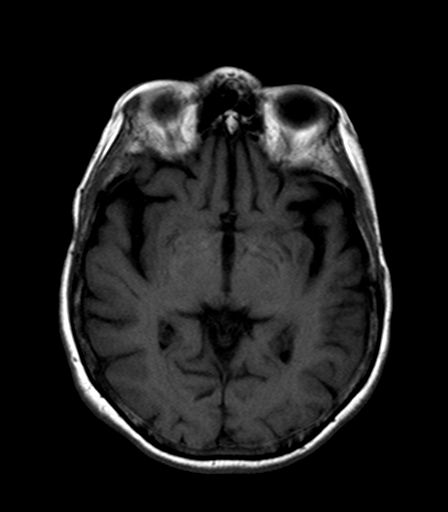
[im 15/20]
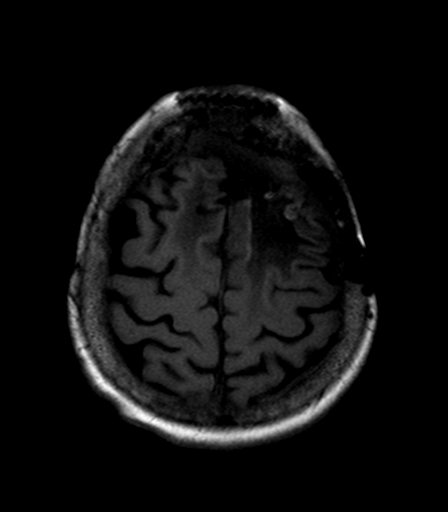
[im 20/20]
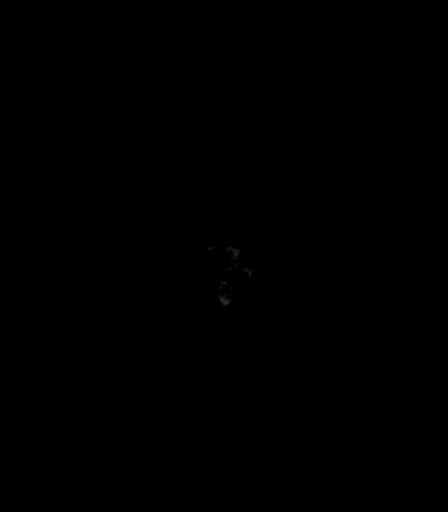

[Series 8: axial blood · axial · 5.0mm · 0.45mm/px · z∈[-79,+70]mm · 5 of 20 slices shown]
[im 1/20]
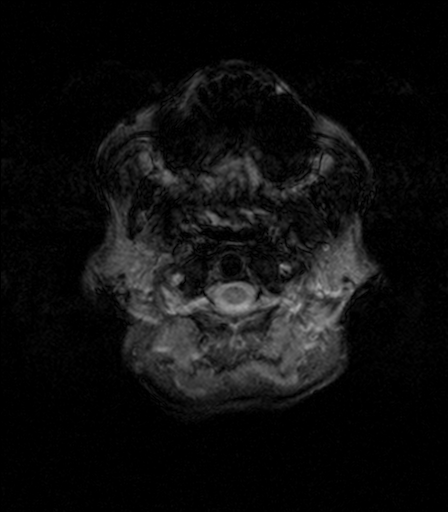
[im 5/20]
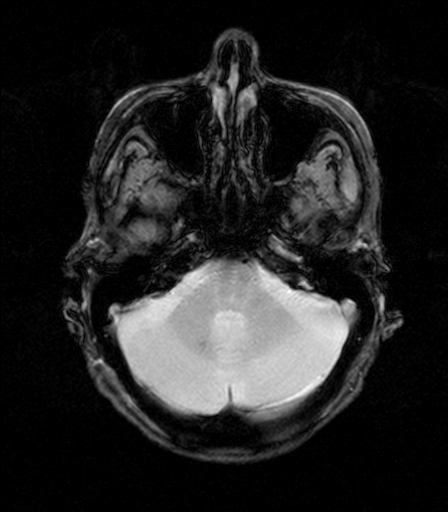
[im 10/20]
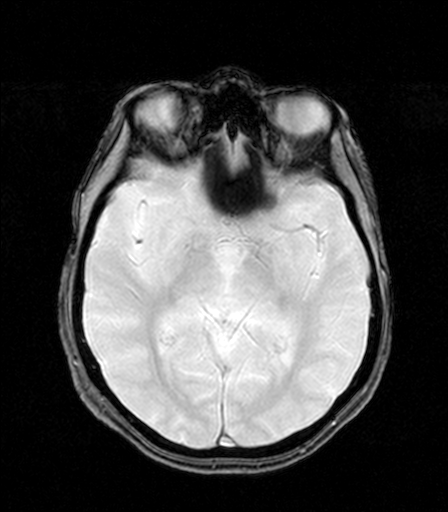
[im 15/20]
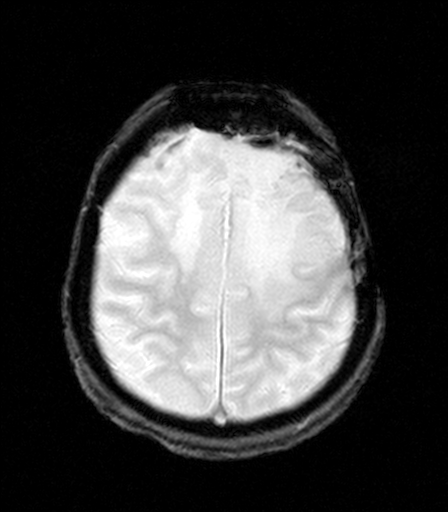
[im 20/20]
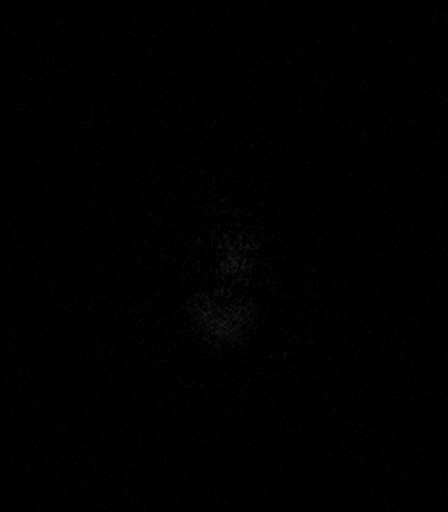

[Series 9: T1 fat-sat post-contrast · axial · 5.0mm · 0.90mm/px · z∈[-66,+63]mm · 3 of 11 slices shown (1 of 2)]
[im 1/11]
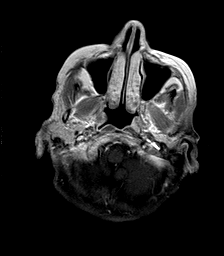
[im 6/11]
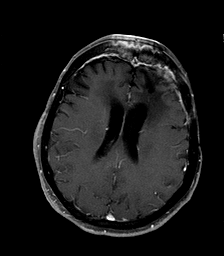
[im 11/11]
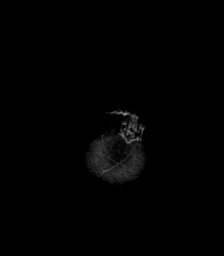

[Series 10: T1 fat-sat post-contrast · coronal · 5.0mm · 0.90mm/px · 7 of 26 slices shown (2 of 2)]
[im 1/26]
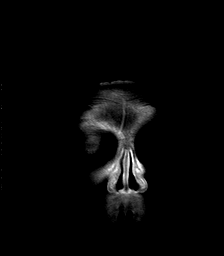
[im 5/26]
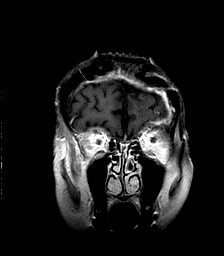
[im 9/26]
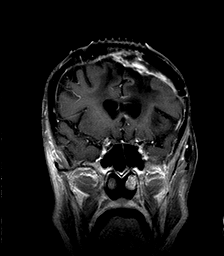
[im 13/26]
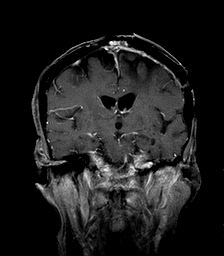
[im 17/26]
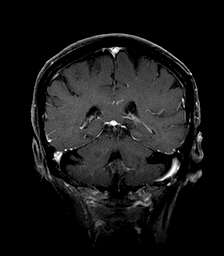
[im 21/26]
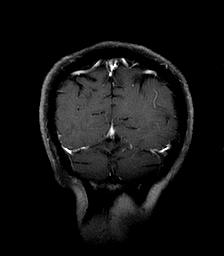
[im 26/26]
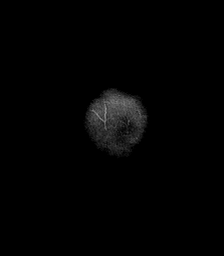

[48 of 48 positions shown; findings below may reference images not displayed]

EXAM

MR head/brain wo/w con

INDICATION

Altered mental status

TECHNIQUE

Multiplanar, multisequence imaging of the brain was performed without contrast.

COMPARISONS

08/10/2020

FINDINGS

Parenchyma: No diffusion restriction, acute hemorrhage, midline shift, or herniation. Evidence of
bifrontal craniotomy change. Extensive bifrontal cystic encephalomalacia with bifrontal T2
hyperintense edema, similar to the prior exam. Additional scattered foci of T2/FLAIR white matter
hyperintensities in the supratentorial white matter.

Ventricles: Ex vacuo dilatation in the frontal points of the lateral ventricles bilaterally..

Extra-axial spaces: No extra-axial collection. There is thickening and enhancement of the bifrontal
dura, left greater than right, similar to the prior exam.

Flow voids: Intact.

Other: Bony calvarium is intact. The visualized paranasal sinuses and mastoid air cells are
essentially clear.

IMPRESSION
1. No MR evidence of an acute intracranial abnormality.
2. Similar extensive bifrontal cystic encephalomalacia.
3. Similar post operative changes with thickening and enhancement of the dura within the frontal
lobes bilaterally, left greater than right.

Tech Notes:

hx benign meningioma, hx craniotomy surgery, follow up, 15ml gadavist

## 2023-03-16 ENCOUNTER — Encounter: Admit: 2023-03-16 | Discharge: 2023-03-16 | Payer: MEDICARE

## 2023-03-16 ENCOUNTER — Inpatient Hospital Stay: Admit: 2023-03-16 | Discharge: 2023-03-16 | Payer: MEDICARE

## 2023-03-16 DIAGNOSIS — M1990 Unspecified osteoarthritis, unspecified site: Secondary | ICD-10-CM

## 2023-03-16 DIAGNOSIS — E039 Hypothyroidism, unspecified: Secondary | ICD-10-CM

## 2023-03-16 DIAGNOSIS — K219 Gastro-esophageal reflux disease without esophagitis: Secondary | ICD-10-CM

## 2023-03-16 DIAGNOSIS — K5790 Diverticulosis of intestine, part unspecified, without perforation or abscess without bleeding: Secondary | ICD-10-CM

## 2023-03-16 DIAGNOSIS — E785 Hyperlipidemia, unspecified: Secondary | ICD-10-CM

## 2023-03-16 DIAGNOSIS — R569 Unspecified convulsions: Secondary | ICD-10-CM

## 2023-03-16 DIAGNOSIS — I10 Essential (primary) hypertension: Secondary | ICD-10-CM

## 2023-03-16 DIAGNOSIS — F419 Anxiety disorder, unspecified: Secondary | ICD-10-CM

## 2023-03-16 DIAGNOSIS — C801 Malignant (primary) neoplasm, unspecified: Secondary | ICD-10-CM

## 2023-03-16 DIAGNOSIS — R112 Nausea with vomiting, unspecified: Secondary | ICD-10-CM

## 2023-03-16 DIAGNOSIS — T1490XA Injury, unspecified, initial encounter: Secondary | ICD-10-CM

## 2023-03-16 DIAGNOSIS — D329 Benign neoplasm of meninges, unspecified: Secondary | ICD-10-CM

## 2023-03-16 DIAGNOSIS — F32A Depression: Secondary | ICD-10-CM

## 2023-03-16 DIAGNOSIS — R011 Cardiac murmur, unspecified: Secondary | ICD-10-CM

## 2023-03-16 LAB — CBC
HEMATOCRIT: 40 % (ref 36–45)
HEMOGLOBIN: 13 g/dL (ref 12.0–15.0)
MCH: 29 pg (ref 26–34)
MCHC: 32 g/dL (ref 32.0–36.0)
MPV: 9.3 FL (ref 7–11)
PLATELET COUNT: 241 K/UL (ref 150–400)
RBC COUNT: 4.4 M/UL (ref 4.0–5.0)
RDW: 13 % (ref 11–15)
WBC COUNT: 20 K/UL — ABNORMAL HIGH (ref 4.5–11.0)

## 2023-03-16 LAB — COMPREHENSIVE METABOLIC PANEL
ALBUMIN: 3.7 g/dL (ref 3.5–5.0)
ALK PHOSPHATASE: 108 U/L (ref 25–110)
ALT: 28 U/L (ref 7–56)
ANION GAP: 11 (ref 3–12)
AST: 27 U/L (ref 7–40)
BLD UREA NITROGEN: 12 mg/dL (ref 7–25)
CHLORIDE: 104 MMOL/L (ref 98–110)
CO2: 25 MMOL/L (ref 21–30)
CREATININE: 0.6 mg/dL (ref 0.4–1.00)
EGFR: >60 mL/min (ref >60)
POTASSIUM: 3.8 MMOL/L (ref 3.5–5.1)
SODIUM: 140 MMOL/L (ref 137–147)
TOTAL BILIRUBIN: 0.5 mg/dL (ref 0.3–1.2)
TOTAL PROTEIN: 6.8 g/dL (ref 6.0–8.0)

## 2023-03-16 LAB — PHOSPHORUS: PHOSPHORUS: 3.4 mg/dL (ref 2.0–4.5)

## 2023-03-16 LAB — LACTIC ACID(LACTATE): LACTIC ACID: 0.9 MMOL/L (ref 0.5–2.0)

## 2023-03-16 LAB — PROTIME INR (PT)
INR: 1.1 (ref 0.9–1.2)
PROTIME: 11 s (ref 10.2–12.9)

## 2023-03-16 LAB — PTT (APTT): PTT: 30 s (ref 24.0–36.5)

## 2023-03-16 LAB — IONIZED CALCIUM: IONIZED CALCIUM: 0.9 MMOL/L — ABNORMAL LOW (ref 1.0–1.3)

## 2023-03-16 LAB — MAGNESIUM: MAGNESIUM: 1.8 mg/dL — ABNORMAL HIGH (ref 1.6–2.6)

## 2023-03-16 MED ORDER — FENTANYL CITRATE (PF) 50 MCG/ML IJ SOLN
12.5-25 ug | INTRAVENOUS | 0 refills | Status: AC | PRN
Start: 2023-03-16 — End: ?
  Administered 2023-03-17: 06:00:00 12.5 ug via INTRAVENOUS

## 2023-03-16 MED ORDER — TIZANIDINE 2 MG PO TAB
2 mg | ORAL | 0 refills | Status: DC | PRN
Start: 2023-03-16 — End: 2023-03-17

## 2023-03-16 MED ORDER — CALCIUM GLUC IN NACL, ISO-OSM 1 GRAM/100 ML IV SOLN
1 g | Freq: Once | INTRAVENOUS | 0 refills | Status: CP
Start: 2023-03-16 — End: ?
  Administered 2023-03-17: 04:00:00 1 g via INTRAVENOUS

## 2023-03-16 MED ORDER — SENNOSIDES 8.6 MG PO TAB
2 | Freq: Two times a day (BID) | ORAL | 0 refills | Status: AC
Start: 2023-03-16 — End: ?

## 2023-03-16 MED ORDER — IOHEXOL 350 MG IODINE/ML IV SOLN
80 mL | Freq: Once | INTRAVENOUS | 0 refills | Status: CP
Start: 2023-03-16 — End: ?
  Administered 2023-03-17: 04:00:00 80 mL via INTRAVENOUS

## 2023-03-16 MED ORDER — PAROXETINE HCL 10 MG PO TAB
20 mg | Freq: Every evening | ORAL | 0 refills | Status: AC
Start: 2023-03-16 — End: ?
  Administered 2023-03-18 – 2023-03-20 (×3): 20 mg via ORAL

## 2023-03-16 MED ORDER — LEVETIRACETAM 500 MG/5 ML IV SOLN
500 mg | Freq: Two times a day (BID) | INTRAVENOUS | 0 refills | Status: AC
Start: 2023-03-16 — End: ?
  Administered 2023-03-17 – 2023-03-19 (×5): 500 mg via INTRAVENOUS

## 2023-03-16 MED ORDER — FAMOTIDINE (PF) 20 MG/2 ML IV SOLN
20 mg | Freq: Every day | INTRAVENOUS | 0 refills | Status: AC
Start: 2023-03-16 — End: ?
  Administered 2023-03-17: 13:00:00 20 mg via INTRAVENOUS

## 2023-03-16 MED ORDER — ACETAMINOPHEN 500 MG PO TAB
1000 mg | ORAL | 0 refills | Status: AC
Start: 2023-03-16 — End: ?
  Administered 2023-03-17: 13:00:00 1000 mg via ORAL

## 2023-03-16 MED ORDER — FENTANYL CITRATE (PF) 50 MCG/ML (ING FOR PCA MIXTURE)
25-50 ug | INTRAVENOUS | 0 refills | Status: DC | PRN
Start: 2023-03-16 — End: 2023-03-17

## 2023-03-16 MED ORDER — POLYETHYLENE GLYCOL 3350 17 GRAM PO PWPK
1 | Freq: Every day | ORAL | 0 refills | Status: AC
Start: 2023-03-16 — End: ?
  Administered 2023-03-18: 13:00:00 17 g via ORAL

## 2023-03-16 MED ORDER — MELATONIN 3 MG PO TAB
3 mg | Freq: Every evening | ORAL | 0 refills | Status: AC
Start: 2023-03-16 — End: ?
  Administered 2023-03-18 – 2023-03-20 (×3): 3 mg via ORAL

## 2023-03-16 MED ORDER — SODIUM CHLORIDE 0.9 % IJ SOLN
50 mL | Freq: Once | INTRAVENOUS | 0 refills | Status: CP
Start: 2023-03-16 — End: ?
  Administered 2023-03-17: 04:00:00 50 mL via INTRAVENOUS

## 2023-03-16 MED ORDER — LIDOCAINE HCL 10 MG/ML (1 %) IJ SOLN
10 mL | Freq: Once | INTRAMUSCULAR | 0 refills | Status: CP
Start: 2023-03-16 — End: ?
  Administered 2023-03-17: 05:00:00 10 mL via INTRAMUSCULAR

## 2023-03-16 MED ORDER — ONDANSETRON HCL (PF) 4 MG/2 ML IJ SOLN
4 mg | INTRAVENOUS | 0 refills | Status: AC | PRN
Start: 2023-03-16 — End: ?
  Administered 2023-03-17: 02:00:00 4 mg via INTRAVENOUS

## 2023-03-16 MED ORDER — TRAZODONE 50 MG PO TAB
25 mg | Freq: Every evening | ORAL | 0 refills | Status: AC | PRN
Start: 2023-03-16 — End: ?
  Administered 2023-03-18 – 2023-03-19 (×2): 25 mg via ORAL

## 2023-03-16 MED ORDER — OXYCODONE 5 MG PO TAB
5-10 mg | ORAL | 0 refills | Status: AC | PRN
Start: 2023-03-16 — End: ?

## 2023-03-16 MED ORDER — TRAZODONE 50 MG PO TAB
50 mg | Freq: Every evening | ORAL | 0 refills | Status: DC | PRN
Start: 2023-03-16 — End: 2023-03-17

## 2023-03-16 NOTE — Progress Notes
Pt. Had a fall from standing, not witnessed, pt. Turned around and fell.  Unclear if LOC, but likely, AMS since fall.      Neuro exam:  Pt. Arousable to voice, not oriented, MAEW, said she feels everything    CT head - 1.7x2.1x2.7 hemorrhage in left parietal lobe, also SDH didn't gave size - small; hemorrhagic focus in right cerebellar hemisphere.  No shift   CT cspine - pending  Xr right shoulder    Pt. Is on ASA    VS:  166/63, HR 89, 93% RA  Pt. Arousable protecting airway    Labs:  pending    Pt. Was stroke activated for hemorrhagic stroke     PMH:  osteo, GERD, hypothyroid

## 2023-03-17 ENCOUNTER — Inpatient Hospital Stay: Admit: 2023-03-17 | Discharge: 2023-03-17 | Payer: MEDICARE

## 2023-03-17 ENCOUNTER — Encounter: Admit: 2023-03-17 | Discharge: 2023-03-17 | Payer: MEDICARE

## 2023-03-17 ENCOUNTER — Inpatient Hospital Stay: Admit: 2023-03-17 | Payer: MEDICARE

## 2023-03-17 DIAGNOSIS — S062X9S Diffuse traumatic brain injury with loss of consciousness of unspecified duration, sequela: Secondary | ICD-10-CM

## 2023-03-17 MED ORDER — POTASSIUM CHLORIDE IN WATER 10 MEQ/50 ML IV PGBK
10 meq | INTRAVENOUS | 0 refills | Status: AC
Start: 2023-03-17 — End: ?
  Administered 2023-03-17: 06:00:00 10 meq via INTRAVENOUS

## 2023-03-17 MED ORDER — POTASSIUM CHLORIDE IN WATER 10 MEQ/50 ML IV PGBK
10 meq | INTRAVENOUS | 0 refills | Status: AC
Start: 2023-03-17 — End: ?
  Administered 2023-03-17: 08:00:00 10 meq via INTRAVENOUS

## 2023-03-17 MED ORDER — MAGNESIUM SULFATE IN WATER 4 GRAM/50 ML (8 %) IV PGBK
4 g | Freq: Once | INTRAVENOUS | 0 refills | Status: AC
Start: 2023-03-17 — End: ?
  Administered 2023-03-17: 06:00:00 4 g via INTRAVENOUS

## 2023-03-17 MED ADMIN — LABETALOL 5 MG/ML IV SYRG [86579]: 10 mg | INTRAVENOUS | @ 07:00:00 | Stop: 2023-03-17 | NDC 00409233924

## 2023-03-17 MED ADMIN — LABETALOL 5 MG/ML IV SYRG [86579]: 10 mg | INTRAVENOUS | @ 12:00:00 | Stop: 2023-03-17 | NDC 00409233924

## 2023-03-17 MED ADMIN — LACTATED RINGERS IV SOLP [4318]: 500 mL | INTRAVENOUS | @ 15:00:00 | Stop: 2023-03-17 | NDC 00338011703

## 2023-03-17 MED ADMIN — SODIUM CHLORIDE 0.9 % IJ SOLN [7319]: 10 mL | INTRAVENOUS | @ 17:00:00 | Stop: 2023-03-17 | NDC 00409488820

## 2023-03-17 MED ADMIN — PERFLUTREN LIPID MICROSPHERES 1.1 MG/ML IV SUSP [79178]: 3 mL | INTRAVENOUS | @ 17:00:00 | Stop: 2023-03-17 | NDC 11994001116

## 2023-03-17 MED ADMIN — ACETAMINOPHEN 160 MG/5 ML PO SOLN [100]: 650 mg | ORAL | @ 19:00:00 | NDC 00121197121

## 2023-03-17 NOTE — Progress Notes
-   Discussed with family that due to depressed GCS I would be unable to clinically clear the c collar. We discussed that no fracture was seen on the imaging at [facility] and that the risk of a missed injury is overall low. Lastly we discussed the benefit of a MRI to further evaluate to avoid any missed injury. They state they would prefer to have the C collar removed and accept the low risk of missed injury    Drue Stager

## 2023-03-17 NOTE — Progress Notes
RT Adult Assessment Note    NAME:Angela Arias             MRN: [id number]             DOB:[birth year]          AGE: 82 y.o.  ADMISSION DATE: 03/16/2023             DAYS ADMITTED: LOS: 0 days    Additional Comments:  Impressions of the patient: No complaints of chest discomfort at this time and resting on 2 lpm nc.  Intervention(s)/outcome(s): N/A  Patient education that was completed: Cough for initial assessment.  Recommendations to the care team: O2 monitoring & be consistent w/ IS.    Vital Signs:  Pulse: 99  RR: 20 PER MINUTE  SpO2: 98 %  O2 Device: Nasal cannula  Liter Flow: 2 Lpm  O2%:      Breath Sounds:   Right Base Breath Sounds: Decreased  Left Base Breath Sounds: Decreased  All Breath Sounds: Decreased;Clear (Implies normal)  Respiratory Effort:   Respiratory Effort/Pattern: Unlabored  Comments:

## 2023-03-18 ENCOUNTER — Encounter: Admit: 2023-03-18 | Discharge: 2023-03-18 | Payer: MEDICARE

## 2023-03-18 MED ADMIN — ACETAMINOPHEN 325 MG PO TAB [101]: 650 mg | ORAL | @ 18:00:00 | NDC 00904677361

## 2023-03-18 MED ADMIN — SIMVASTATIN 20 MG PO TAB [80437]: 20 mg | ORAL | @ 13:00:00 | Stop: 2023-03-18 | NDC 63739057210

## 2023-03-18 MED ADMIN — POTASSIUM CHLORIDE 20 MEQ PO TBTQ [35943]: 20 meq | ORAL | @ 11:00:00 | Stop: 2023-03-18 | NDC 00832532510

## 2023-03-18 MED ADMIN — LEVOTHYROXINE 75 MCG PO TAB [4422]: 75 ug | ORAL | @ 11:00:00 | NDC 00904695161

## 2023-03-18 MED ADMIN — ACETAMINOPHEN 325 MG PO TAB [101]: 650 mg | ORAL | @ 23:00:00 | NDC 00904677361

## 2023-03-18 MED ADMIN — ACETAMINOPHEN 160 MG/5 ML PO SOLN [100]: 650 mg | ORAL | @ 02:00:00 | NDC 00121197121

## 2023-03-18 MED ADMIN — ACETAMINOPHEN 160 MG/5 ML PO SOLN [100]: 650 mg | ORAL | @ 08:00:00 | Stop: 2023-03-18 | NDC 00121197121

## 2023-03-18 MED ADMIN — SENNOSIDES 8.8 MG/5 ML PO SYRP [79026]: 17.6 mg | ORAL | @ 13:00:00 | Stop: 2023-03-18 | NDC 48433021905

## 2023-03-18 MED ADMIN — FAMOTIDINE 40 MG/5 ML (8 MG/ML) PO SUSR [81921]: 20 mg | ORAL | @ 13:00:00 | Stop: 2023-03-18 | NDC 54029035225

## 2023-03-19 ENCOUNTER — Inpatient Hospital Stay: Admit: 2023-03-19 | Discharge: 2023-03-19 | Payer: MEDICARE

## 2023-03-19 MED ADMIN — LEVOTHYROXINE 75 MCG PO TAB [4422]: 75 ug | ORAL | @ 11:00:00 | NDC 00904695161

## 2023-03-19 MED ADMIN — AMLODIPINE 5 MG PO TAB [79041]: 5 mg | ORAL | @ 14:00:00 | NDC 00904637061

## 2023-03-19 MED ADMIN — SENNOSIDES 8.6 MG PO TAB [11349]: 2 | ORAL | @ 01:00:00 | NDC 00904725261

## 2023-03-19 MED ADMIN — ENOXAPARIN 30 MG/0.3 ML SC SYRG [85048]: 30 mg | SUBCUTANEOUS | @ 01:00:00 | NDC 71288043280

## 2023-03-19 MED ADMIN — ENOXAPARIN 30 MG/0.3 ML SC SYRG [85048]: 30 mg | SUBCUTANEOUS | @ 14:00:00 | NDC 71288043280

## 2023-03-19 MED ADMIN — ACETAMINOPHEN 325 MG PO TAB [101]: 650 mg | ORAL | @ 18:00:00 | NDC 00904677361

## 2023-03-19 MED ADMIN — ACETAMINOPHEN 325 MG PO TAB [101]: 650 mg | ORAL | @ 11:00:00 | NDC 00904677361

## 2023-03-19 MED ADMIN — ERGOCALCIFEROL (VITAMIN D2) 200 MCG/ML (8,000 UNIT/ML) PO DROP [83031]: 50000 [IU] | ORAL | @ 18:00:00 | NDC 75834001060

## 2023-03-19 MED ADMIN — LEVETIRACETAM 500 MG PO TAB [77943]: 500 mg | ORAL | @ 14:00:00 | Stop: 2023-03-24 | NDC 00904712461

## 2023-03-19 MED ADMIN — PANTOPRAZOLE 20 MG PO TBEC [79463]: 20 mg | ORAL | @ 14:00:00 | NDC 50268058511

## 2023-03-19 MED ADMIN — MEMANTINE 5 MG PO TAB [89655]: 5 mg | ORAL | @ 18:00:00 | NDC 00591387045

## 2023-03-20 MED ADMIN — LEVETIRACETAM 500 MG PO TAB [77943]: 500 mg | ORAL | @ 14:00:00 | Stop: 2023-03-20 | NDC 00904712461

## 2023-03-20 MED ADMIN — ACETAMINOPHEN 325 MG PO TAB [101]: 650 mg | ORAL | @ 11:00:00 | Stop: 2023-03-20 | NDC 00904677361

## 2023-03-20 MED ADMIN — LEVETIRACETAM 500 MG PO TAB [77943]: 500 mg | ORAL | @ 03:00:00 | Stop: 2023-03-24 | NDC 00904712461

## 2023-03-20 MED ADMIN — ACETAMINOPHEN 325 MG PO TAB [101]: 650 mg | ORAL | @ 05:00:00 | Stop: 2023-03-20 | NDC 00904677361

## 2023-03-20 MED ADMIN — ENOXAPARIN 30 MG/0.3 ML SC SYRG [85048]: 30 mg | SUBCUTANEOUS | @ 03:00:00 | NDC 71288043280

## 2023-03-20 MED ADMIN — LEVOTHYROXINE 75 MCG PO TAB [4422]: 75 ug | ORAL | @ 11:00:00 | Stop: 2023-03-20 | NDC 00904695161

## 2023-03-20 MED ADMIN — MEMANTINE 5 MG PO TAB [89655]: 5 mg | ORAL | @ 14:00:00 | Stop: 2023-03-20 | NDC 00591387045

## 2023-03-20 MED ADMIN — ENOXAPARIN 30 MG/0.3 ML SC SYRG [85048]: 30 mg | SUBCUTANEOUS | @ 14:00:00 | Stop: 2023-03-20 | NDC 63323055921

## 2023-03-20 MED ADMIN — PANTOPRAZOLE 20 MG PO TBEC [79463]: 20 mg | ORAL | @ 14:00:00 | Stop: 2023-03-20 | NDC 50268058511

## 2023-03-20 MED ADMIN — AMLODIPINE 5 MG PO TAB [79041]: 5 mg | ORAL | @ 14:00:00 | Stop: 2023-03-20 | NDC 00904637061

## 2023-03-20 MED ADMIN — TRAZODONE(#) 10MG/ML PO SUSP [210215]: 12.5 mg | ORAL | @ 05:00:00 | Stop: 2023-03-20 | NDC 54029262809

## 2023-04-06 ENCOUNTER — Encounter: Admit: 2023-04-06 | Discharge: 2023-04-06 | Payer: MEDICARE

## 2023-04-10 ENCOUNTER — Encounter: Admit: 2023-04-10 | Discharge: 2023-04-10 | Payer: MEDICARE

## 2023-04-10 DIAGNOSIS — S72001D Fracture of unspecified part of neck of right femur, subsequent encounter for closed fracture with routine healing: Secondary | ICD-10-CM

## 2023-04-15 ENCOUNTER — Ambulatory Visit: Admit: 2023-04-15 | Discharge: 2023-04-15 | Payer: MEDICARE

## 2023-04-15 ENCOUNTER — Encounter: Admit: 2023-04-15 | Discharge: 2023-04-15 | Payer: MEDICARE

## 2023-04-15 DIAGNOSIS — R112 Nausea with vomiting, unspecified: Secondary | ICD-10-CM

## 2023-04-15 DIAGNOSIS — K5790 Diverticulosis of intestine, part unspecified, without perforation or abscess without bleeding: Secondary | ICD-10-CM

## 2023-04-15 DIAGNOSIS — R569 Unspecified convulsions: Secondary | ICD-10-CM

## 2023-04-15 DIAGNOSIS — M1990 Unspecified osteoarthritis, unspecified site: Secondary | ICD-10-CM

## 2023-04-15 DIAGNOSIS — K219 Gastro-esophageal reflux disease without esophagitis: Secondary | ICD-10-CM

## 2023-04-15 DIAGNOSIS — D329 Benign neoplasm of meninges, unspecified: Secondary | ICD-10-CM

## 2023-04-15 DIAGNOSIS — E785 Hyperlipidemia, unspecified: Secondary | ICD-10-CM

## 2023-04-15 DIAGNOSIS — F32A Depression: Secondary | ICD-10-CM

## 2023-04-15 DIAGNOSIS — R011 Cardiac murmur, unspecified: Secondary | ICD-10-CM

## 2023-04-15 DIAGNOSIS — S062X9S Diffuse traumatic brain injury with loss of consciousness of unspecified duration, sequela: Secondary | ICD-10-CM

## 2023-04-15 DIAGNOSIS — E039 Hypothyroidism, unspecified: Secondary | ICD-10-CM

## 2023-04-15 DIAGNOSIS — I10 Essential (primary) hypertension: Secondary | ICD-10-CM

## 2023-04-15 DIAGNOSIS — F419 Anxiety disorder, unspecified: Secondary | ICD-10-CM

## 2023-04-15 DIAGNOSIS — C801 Malignant (primary) neoplasm, unspecified: Secondary | ICD-10-CM

## 2023-05-28 ENCOUNTER — Encounter: Admit: 2023-05-28 | Discharge: 2023-05-28 | Payer: MEDICARE

## 2023-06-08 ENCOUNTER — Encounter: Admit: 2023-06-08 | Discharge: 2023-06-08 | Payer: MEDICARE

## 2023-06-08 DIAGNOSIS — N1330 Unspecified hydronephrosis: Secondary | ICD-10-CM

## 2023-06-08 DIAGNOSIS — N200 Calculus of kidney: Secondary | ICD-10-CM

## 2023-06-08 DIAGNOSIS — S72001D Fracture of unspecified part of neck of right femur, subsequent encounter for closed fracture with routine healing: Secondary | ICD-10-CM

## 2023-07-02 ENCOUNTER — Encounter: Admit: 2023-07-02 | Discharge: 2023-07-02 | Payer: MEDICARE

## 2023-07-02 NOTE — Telephone Encounter
LVM with radiology scheduling number [phone] to schedule imaging exam    --renal ultrasound

## 2023-07-14 ENCOUNTER — Encounter: Admit: 2023-07-14 | Discharge: 2023-07-14 | Payer: MEDICARE

## 2023-07-14 NOTE — Telephone Encounter
LVM with radiology scheduling number [phone] to schedule imaging exam    --renal ultrasound

## 2023-10-06 ENCOUNTER — Encounter: Admit: 2023-10-06 | Discharge: 2023-10-06 | Payer: MEDICARE

## 2024-01-14 ENCOUNTER — Encounter: Admit: 2024-01-14 | Discharge: 2024-01-14 | Payer: MEDICARE
